# Patient Record
Sex: Female | Born: 1965
Health system: Southern US, Community
[De-identification: ages and names within clinical notes are randomized; demographics above are authoritative.]

## PROBLEM LIST (undated history)

## (undated) DIAGNOSIS — I10 Essential (primary) hypertension: Secondary | ICD-10-CM

## (undated) DIAGNOSIS — N289 Disorder of kidney and ureter, unspecified: Secondary | ICD-10-CM

## (undated) DIAGNOSIS — N184 Chronic kidney disease, stage 4 (severe): Secondary | ICD-10-CM

## (undated) HISTORY — PX: BREAST REDUCTION SURGERY: SHX8

## (undated) HISTORY — PX: ABDOMINAL HYSTERECTOMY: SHX81

## (undated) HISTORY — PX: CHOLECYSTECTOMY: SHX55

---

## 2003-08-11 ENCOUNTER — Emergency Department (HOSPITAL_COMMUNITY): Admission: EM | Admit: 2003-08-11 | Discharge: 2003-08-11 | Payer: Self-pay | Admitting: Emergency Medicine

## 2010-07-15 ENCOUNTER — Emergency Department (HOSPITAL_BASED_OUTPATIENT_CLINIC_OR_DEPARTMENT_OTHER)
Admission: EM | Admit: 2010-07-15 | Discharge: 2010-07-15 | Disposition: A | Discharge status: Home / Self Care | Payer: No Typology Code available for payment source | Attending: Emergency Medicine | Admitting: Emergency Medicine

## 2010-07-15 DIAGNOSIS — IMO0002 Reserved for concepts with insufficient information to code with codable children: Secondary | ICD-10-CM | POA: Insufficient documentation

## 2010-07-15 DIAGNOSIS — I10 Essential (primary) hypertension: Secondary | ICD-10-CM | POA: Insufficient documentation

## 2011-09-07 ENCOUNTER — Encounter (HOSPITAL_BASED_OUTPATIENT_CLINIC_OR_DEPARTMENT_OTHER): Payer: Self-pay | Admitting: Emergency Medicine

## 2011-09-07 ENCOUNTER — Emergency Department (HOSPITAL_BASED_OUTPATIENT_CLINIC_OR_DEPARTMENT_OTHER)
Admission: EM | Admit: 2011-09-07 | Discharge: 2011-09-07 | Disposition: A | Discharge status: Home / Self Care | Payer: No Typology Code available for payment source | Attending: Emergency Medicine | Admitting: Emergency Medicine

## 2011-09-07 DIAGNOSIS — J111 Influenza due to unidentified influenza virus with other respiratory manifestations: Secondary | ICD-10-CM | POA: Insufficient documentation

## 2011-09-07 DIAGNOSIS — J329 Chronic sinusitis, unspecified: Secondary | ICD-10-CM

## 2011-09-07 DIAGNOSIS — I10 Essential (primary) hypertension: Secondary | ICD-10-CM | POA: Insufficient documentation

## 2011-09-07 DIAGNOSIS — J45909 Unspecified asthma, uncomplicated: Secondary | ICD-10-CM | POA: Insufficient documentation

## 2011-09-07 HISTORY — DX: Essential (primary) hypertension: I10

## 2011-09-07 MED ORDER — AMOXICILLIN 500 MG PO CAPS: 500.0000 mg | ORAL_CAPSULE | Freq: Three times a day (TID) | ORAL | Status: AC

## 2011-09-07 NOTE — ED Provider Notes (Signed)
History     CSN: [id number]  Arrival date & time 09/07/11  1818   First MD Initiated Contact with Patient 09/07/11 1929      Chief Complaint  Patient presents with  . Influenza    (Consider location/radiation/quality/duration/timing/severity/associated sxs/prior treatment) Patient is a 46 y.o. female presenting with cough. The history is provided by the patient. No language interpreter was used.  Cough This is a new problem. The current episode started 2 days ago. The problem occurs constantly. The problem has been gradually worsening. The cough is productive of sputum. The maximum temperature recorded prior to her arrival was 100 to 100.9 F. The fever has been present for 1 to 2 days. Pertinent negatives include no shortness of breath. She has tried nothing for the symptoms. The treatment provided no relief. She is not a smoker. Her past medical history does not include pneumonia.   Pt complains of a soret hroat and a cough.  Pt reports sinus congestion and aching Past Medical History  Diagnosis Date  . Asthma   . Hypertension     Past Surgical History  Procedure Date  . Cholecystectomy   . Abdominal hysterectomy     No family history on file.  History  Substance Use Topics  . Smoking status: Never Smoker   . Smokeless tobacco: Not on file  . Alcohol Use: No    OB History    Grav Para Term Preterm Abortions TAB SAB Ect Mult Living                  Review of Systems  Respiratory: Positive for cough. Negative for shortness of breath.   All other systems reviewed and are negative.    Allergies  Review of patient's allergies indicates no known allergies.  Home Medications   Current Outpatient Rx  Name Route Sig Dispense Refill  . ALBUTEROL SULFATE HFA 108 (90 BASE) MCG/ACT IN AERS Inhalation Inhale 2 puffs into the lungs every 6 (six) hours as needed. Patient uses this medication for shortness of breath and prn.    . AMLODIPINE-OLMESARTAN 10-40 MG PO TABS  Oral Take 1 tablet by mouth daily.    . ASPIRIN-ACETAMINOPHEN-CAFFEINE 250-250-65 MG PO TABS Oral Take 2 tablets by mouth every 6 (six) hours as needed. Patient used this medication for her headache.    Marland Kitchen ALKA-SELTZER PLUS COLD PO Oral Take 2 tablets by mouth daily as needed. Patient used this medication for her cold.    Marland Kitchen BIOFREEZE EX Apply externally Apply 1 application topically daily as needed. Patient used this medication for her shoulder.      BP 165/119  Pulse 122  Temp(Src) 99.7 F (37.6 C) (Oral)  Resp 16  Ht 5\' 4"  (1.626 m)  Wt 250 lb (113.399 kg)  BMI 42.91 kg/m2  SpO2 100%  Physical Exam  Nursing note and vitals reviewed. Constitutional: She is oriented to person, place, and time. She appears well-developed and well-nourished.  HENT:  Head: Normocephalic and atraumatic.  Right Ear: External ear normal.  Left Ear: External ear normal.  Nose: Nose normal.  Mouth/Throat: Oropharynx is clear and moist.  Eyes: Conjunctivae and EOM are normal. Pupils are equal, round, and reactive to light.  Neck: Normal range of motion. Neck supple.  Cardiovascular: Normal rate.   Pulmonary/Chest: Effort normal.  Abdominal: Soft.  Musculoskeletal: Normal range of motion.  Neurological: She is alert and oriented to person, place, and time. She has normal reflexes.  Skin: Skin is warm.  Psychiatric:  She has a normal mood and affect.    ED Course  Procedures (including critical care time)  Labs Reviewed - No data to display No results found.   No diagnosis found.    MDM  Pt given rx for amoxicillin.    Pt advised to use her inhaler.         [place], [place] 09/07/11 2009

## 2011-09-07 NOTE — Discharge Instructions (Signed)

## 2011-09-07 NOTE — ED Notes (Signed)
Patient states around yesterday at 8am she started having a sore throat and started having aches "everywhere."  C/o body aches, headache, chills, and unknown if fever.  Denies n/v/d

## 2011-09-08 NOTE — ED Provider Notes (Signed)
Medical screening examination/treatment/procedure(s) were performed by non-physician practitioner and as supervising physician I was immediately available for consultation/collaboration.   Mylinda Latina III, MD 09/08/11 [phone]

## 2011-09-15 ENCOUNTER — Emergency Department (HOSPITAL_BASED_OUTPATIENT_CLINIC_OR_DEPARTMENT_OTHER)
Admission: EM | Admit: 2011-09-15 | Discharge: 2011-09-15 | Disposition: A | Discharge status: Home / Self Care | Payer: No Typology Code available for payment source | Attending: Emergency Medicine | Admitting: Emergency Medicine

## 2011-09-15 ENCOUNTER — Encounter (HOSPITAL_BASED_OUTPATIENT_CLINIC_OR_DEPARTMENT_OTHER): Payer: Self-pay | Admitting: *Deleted

## 2011-09-15 DIAGNOSIS — I10 Essential (primary) hypertension: Secondary | ICD-10-CM | POA: Insufficient documentation

## 2011-09-15 DIAGNOSIS — R21 Rash and other nonspecific skin eruption: Secondary | ICD-10-CM | POA: Insufficient documentation

## 2011-09-15 MED ORDER — LORATADINE 10 MG PO TABS
10.0000 mg | ORAL_TABLET | Freq: Once | ORAL | Status: AC
  Administered 2011-09-15: 10 mg via ORAL
  Filled 2011-09-15: qty 1

## 2011-09-15 MED ORDER — FAMOTIDINE 20 MG PO TABS: 20.0000 mg | ORAL_TABLET | Freq: Two times a day (BID) | ORAL | Status: DC

## 2011-09-15 MED ORDER — LORATADINE 10 MG PO TABS: 10.0000 mg | ORAL_TABLET | Freq: Every day | ORAL | Status: DC

## 2011-09-15 MED ORDER — FAMOTIDINE 20 MG PO TABS
20.0000 mg | ORAL_TABLET | Freq: Once | ORAL | Status: AC
  Administered 2011-09-15: 20 mg via ORAL
  Filled 2011-09-15: qty 1

## 2011-09-15 NOTE — Discharge Instructions (Signed)
Drug Rash Skin reactions can be caused by several different drugs. Allergy to the medicine can cause itching, hives, and other rashes. Sun exposure causes a red rash with some medicines. Mononucleosis virus can cause a similar red rash when you are taking antibiotics. Sometimes, the rash may be accompanied by pain. The drug rash may happen with new drugs or with medicines that you have been taking for a while. The rash cannot be spread from person to person. In most cases, the symptoms of a drug rash are gone within a few days of stopping the medicine. Your rash, including hives (urticaria), is most likely from the following medicines:  Antibiotics or antimicrobials.   Anticonvulsants or seizure medicines.   Antihypertensives or blood pressure medicines.   Antimalarials.   Antidepressants or depression medicines.   Antianxiety drugs.   Diuretics or water pills.   Nonsteroidal anti-inflammatory drugs.   Simvastatin.   Lithium.   Omeprazole.   Allopurinol.   Pseudoephedrine.   Amiodarone.   Packed red blood cells, when you get a blood transfusion.   Contrast media, such as when getting an imaging test (CT or CAT scan).  This drug list is not all inclusive, but drug rashes have been reported with all the medicines listed above.Your caregiver will tell you which medicines to avoid. If you react to a medicine, a similar or worse reaction can occur the next time you take it. If you need to stop taking an antibiotic because of a drug rash, an alternative antibiotic may be needed to get rid of your infection. Antihistamine or cortisone drugs may be prescribed to help relieve your symptoms. Stay out of the sun until the rash is completely gone.  Be sure to let your caregiver know about your drug reaction. Do not take this medicine in the future. Call your caregiver if your drug rash does not improve within 3 to 4 days. SEEK IMMEDIATE MEDICAL CARE IF:   You develop breathing problems,  swelling in the throat, or wheezing.   You have weakness, fainting, fever, and muscle or joint pains.   You develop blisters or peeling of skin, especially around the mouth.  Document Released: 06/09/2004 Document Revised: 04/21/2011 Document Reviewed: 03/20/2008 [facility] Patient Information 2012 [place].

## 2011-09-15 NOTE — ED Provider Notes (Signed)
History     CSN: [id number]  Arrival date & time 09/15/11  [id number]   First MD Initiated Contact with Patient 09/15/11 [phone]      Chief Complaint  Patient presents with  . Rash    HPI The patient presents with concerns of a rash.  Notably, the patient was evaluated here one week ago for generalized fatigue, myalgias, sinus congestion and diagnosed with sinus infection.  She is discharged with amoxicillin, which she notes that she began to take several days ago.  Over the past 3 days she has gradually developed a diffuse, slightly raised, pruritic rash.  She notes that even prior to her initiation of the antibiotics, her other complaints all subsided she now denies any myalgia, pharyngitis, dyspnea, cough, headache, congestion.  Since onset the rash has been persistent, irritating, nonpainful.  No attempts at relief with any medication.  No clear exacerbating factor.     Past Medical History  Diagnosis Date  . Asthma   . Hypertension     Past Surgical History  Procedure Date  . Cholecystectomy   . Abdominal hysterectomy     History reviewed. No pertinent family history.  History  Substance Use Topics  . Smoking status: Never Smoker   . Smokeless tobacco: Not on file  . Alcohol Use: No    OB History    Grav Para Term Preterm Abortions TAB SAB Ect Mult Living                  Review of Systems  Constitutional:       HPI  HENT:       HPI otherwise negative  Eyes: Negative.   Respiratory:       HPI, otherwise negative  Cardiovascular:       HPI, otherwise nmegative  Gastrointestinal: Negative for vomiting.  Genitourinary:       HPI, otherwise negative  Musculoskeletal:       HPI, otherwise negative  Skin: Negative.   Neurological: Negative for syncope.    Allergies  Review of patient's allergies indicates no known allergies.  Home Medications   Current Outpatient Rx  Name Route Sig Dispense Refill  . ALBUTEROL SULFATE HFA 108 (90 BASE) MCG/ACT IN AERS  Inhalation Inhale 2 puffs into the lungs every 6 (six) hours as needed. Patient uses this medication for shortness of breath and prn.    . AMLODIPINE-OLMESARTAN 10-40 MG PO TABS Oral Take 1 tablet by mouth daily.    . AMOXICILLIN 500 MG PO CAPS Oral Take 1 capsule (500 mg total) by mouth 3 (three) times daily. 30 capsule 0  . ASPIRIN-ACETAMINOPHEN-CAFFEINE 250-250-65 MG PO TABS Oral Take 2 tablets by mouth every 6 (six) hours as needed. Patient used this medication for her headache.    Marland Kitchen ALKA-SELTZER PLUS COLD PO Oral Take 2 tablets by mouth daily as needed. Patient used this medication for her cold.    Marland Kitchen FAMOTIDINE 20 MG PO TABS Oral Take 1 tablet (20 mg total) by mouth 2 (two) times daily. 10 tablet 0  . LORATADINE 10 MG PO TABS Oral Take 1 tablet (10 mg total) by mouth daily. 5 tablet 0  . BIOFREEZE EX Apply externally Apply 1 application topically daily as needed. Patient used this medication for her shoulder.      BP 155/116  Pulse 93  Temp(Src) 98.7 F (37.1 C) (Oral)  Ht 5\' 4"  (1.626 m)  Wt 213 lb (96.616 kg)  BMI 36.56 kg/m2  SpO2 99%  Physical Exam  Nursing note and vitals reviewed. Constitutional: She is oriented to person, place, and time. She appears well-developed and well-nourished. No distress.  HENT:  Head: Normocephalic and atraumatic. Head is without right periorbital erythema and without left periorbital erythema. No trismus in the jaw.  Mouth/Throat: Uvula is midline, oropharynx is clear and moist and mucous membranes are normal. No oropharyngeal exudate, posterior oropharyngeal edema, posterior oropharyngeal erythema or tonsillar abscesses.  Eyes: Conjunctivae and EOM are normal.  Cardiovascular: Normal rate and regular rhythm.   Pulmonary/Chest: Effort normal and breath sounds normal. No stridor. No respiratory distress.  Abdominal: She exhibits no distension.  Musculoskeletal: She exhibits no edema.  Neurological: She is alert and oriented to person, place, and  time. No cranial nerve deficit.  Skin: Skin is warm and dry.       There is a diffuse, mildly raised, non-erythematous rash throughout the patient's habitus.  There are no oral or glabrous skin lesions.  Psychiatric: She has a normal mood and affect.    ED Course  Procedures (including critical care time)  Labs Reviewed - No data to display No results found.   1. Rash       MDM  This generally well female now presents with concerns of rash that began following initiation of antibiotic therapy for a presumed sinus infection.  In general, the patient's denial of any other notable complaints his reassuring.  The patient's rash may be explained to her amoxicillin use, the absence of urticarial aspects is suggestive of this.  The patient's denial of any dyspnea, and the absence of any oropharyngeal edema is reassuring, the patient was discharged with instructions on antihistamine therapy for several days, and cessation of antibiotics.  She'll follow up with her primary care physician as needed.     Carmin Muskrat, MD 09/15/11 [phone]

## 2011-09-15 NOTE — ED Notes (Signed)
Reports she was seen last week diagnosed with sinus infection given prescription for amoxicillin didn't start taking until Friday yesterday she noticed a rash and had been itching

## 2012-04-20 ENCOUNTER — Encounter (HOSPITAL_BASED_OUTPATIENT_CLINIC_OR_DEPARTMENT_OTHER): Payer: Self-pay | Admitting: *Deleted

## 2012-04-20 ENCOUNTER — Emergency Department (HOSPITAL_BASED_OUTPATIENT_CLINIC_OR_DEPARTMENT_OTHER)
Admission: EM | Admit: 2012-04-20 | Discharge: 2012-04-20 | Disposition: A | Discharge status: Home / Self Care | Payer: No Typology Code available for payment source | Attending: Emergency Medicine | Admitting: Emergency Medicine

## 2012-04-20 DIAGNOSIS — Z7982 Long term (current) use of aspirin: Secondary | ICD-10-CM | POA: Insufficient documentation

## 2012-04-20 DIAGNOSIS — I10 Essential (primary) hypertension: Secondary | ICD-10-CM | POA: Insufficient documentation

## 2012-04-20 DIAGNOSIS — Z79899 Other long term (current) drug therapy: Secondary | ICD-10-CM | POA: Insufficient documentation

## 2012-04-20 DIAGNOSIS — J45909 Unspecified asthma, uncomplicated: Secondary | ICD-10-CM | POA: Insufficient documentation

## 2012-04-20 DIAGNOSIS — M719 Bursopathy, unspecified: Secondary | ICD-10-CM | POA: Insufficient documentation

## 2012-04-20 DIAGNOSIS — M67919 Unspecified disorder of synovium and tendon, unspecified shoulder: Secondary | ICD-10-CM | POA: Insufficient documentation

## 2012-04-20 MED ORDER — HYDROCODONE-ACETAMINOPHEN 5-325 MG PO TABS: 2.0000 | ORAL_TABLET | Freq: Four times a day (QID) | ORAL | Status: DC | PRN

## 2012-04-20 MED ORDER — OXYCODONE-ACETAMINOPHEN 5-325 MG PO TABS: 2.0000 | ORAL_TABLET | Freq: Four times a day (QID) | ORAL | Status: DC | PRN

## 2012-04-20 NOTE — ED Provider Notes (Signed)
History     CSN: [id number]  Arrival date & time 04/20/12  0609   First MD Initiated Contact with Patient 04/20/12 [phone]      Chief Complaint  Patient presents with  . Shoulder Pain    (Consider location/radiation/quality/duration/timing/severity/associated sxs/prior treatment) HPI This 46 year old female has a few days of gradual onset constant severe left shoulder pain worse with any position change especially left shoulder flexion and trying to move her arm above her head which she is unable to do due to the pain, she has no distal weakness or numbness to her left hand, she is no neck pain or back pain or trauma, she is no chest pain shortness breath abdominal pain or back pain, she is no swelling or rash, she is no treatment prior to arrival, her pain is severe, her pain is without radiation. Her pain is nonexertional. Past Medical History  Diagnosis Date  . Asthma   . Hypertension     Past Surgical History  Procedure Date  . Cholecystectomy   . Abdominal hysterectomy     No family history on file.  History  Substance Use Topics  . Smoking status: Never Smoker   . Smokeless tobacco: Not on file  . Alcohol Use: No    OB History    Grav Para Term Preterm Abortions TAB SAB Ect Mult Living                  Review of Systems 10 Systems reviewed and are negative for acute change except as noted in the HPI. Allergies  Amoxicillin and Vicodin  Home Medications   Current Outpatient Rx  Name  Route  Sig  Dispense  Refill  . ALBUTEROL SULFATE HFA 108 (90 BASE) MCG/ACT IN AERS   Inhalation   Inhale 2 puffs into the lungs every 6 (six) hours as needed. Patient uses this medication for shortness of breath and prn.         . AMLODIPINE-OLMESARTAN 10-40 MG PO TABS   Oral   Take 1 tablet by mouth daily.         . ASPIRIN-ACETAMINOPHEN-CAFFEINE 250-250-65 MG PO TABS   Oral   Take 2 tablets by mouth every 6 (six) hours as needed. Patient used this medication for her  headache.         Marland Kitchen ALKA-SELTZER PLUS COLD PO   Oral   Take 2 tablets by mouth daily as needed. Patient used this medication for her cold.         Marland Kitchen FAMOTIDINE 20 MG PO TABS   Oral   Take 1 tablet (20 mg total) by mouth 2 (two) times daily.   10 tablet   0   . LORATADINE 10 MG PO TABS   Oral   Take 1 tablet (10 mg total) by mouth daily.   5 tablet   0   . BIOFREEZE EX   Apply externally   Apply 1 application topically daily as needed. Patient used this medication for her shoulder.         . OXYCODONE-ACETAMINOPHEN 5-325 MG PO TABS   Oral   Take 2 tablets by mouth every 6 (six) hours as needed for pain.   20 tablet   0     BP 198/112  Pulse 80  Temp 98.4 F (36.9 C) (Oral)  Resp 18  Wt 230 lb (104.327 kg)  SpO2 100%  Physical Exam  Nursing note and vitals reviewed. Constitutional:  Awake, alert, nontoxic appearance.  HENT:  Head: Atraumatic.  Eyes: Right eye exhibits no discharge. Left eye exhibits no discharge.  Neck: Neck supple.  Cardiovascular: Normal rate and regular rhythm.   No murmur heard. Pulmonary/Chest: Effort normal and breath sounds normal. No respiratory distress. She has no wheezes. She has no rales. She exhibits no tenderness.  Abdominal: Soft. There is no tenderness. There is no rebound.  Musculoskeletal: She exhibits tenderness. She exhibits no edema.       Baseline ROM, no obvious new focal weakness. Cervical spine nontender, back nontender, right arm and both legs nontender, left arm is isolated tenderness to the left anterior shoulder only with decreased range of motion to the left shoulder due to the pain, she is unable to move her left arm above her head due to the pain, she does have the capability of the ducting her left shoulder to 90 without much difficulty and she has a negative shoulder drop test, she has limited left shoulder flexion due to the pain and tenderness to the anterior shoulder, she is no tenderness to the biceps  or triceps elbow forearm wrist or hand, she has capillary refill less than 2 seconds with normal light touch to the left hand, she is intact strength and sensation in the distributions of the median, radial, and ulnar nerve function to the left hand, she has normal light touch over her deltoid region.  Neurological:       Mental status and motor strength appears baseline for patient and situation.  Skin: No rash noted.  Psychiatric: She has a normal mood and affect.    ED Course  Procedures (including critical care time)  Labs Reviewed - No data to display No results found.   1. Shoulder tendinitis       MDM  Patient informed of clinical course, understand medical decision-making process, and agree with plan.  I doubt any other EMC precluding discharge at this time including, but not necessarily limited to the following:ACS, radiculopathy, CVA, DVT.        Babette Relic, MD 04/20/12 [phone]

## 2012-04-20 NOTE — ED Notes (Signed)
Pt c/o left shoulder pain x3 days. Pt sts pain worse with movement. Pt denies injury but sts she does heavy lifting at work.

## 2012-04-20 NOTE — ED Notes (Signed)
MD at bedside. 

## 2012-08-16 DIAGNOSIS — J45909 Unspecified asthma, uncomplicated: Secondary | ICD-10-CM | POA: Insufficient documentation

## 2012-10-20 DIAGNOSIS — J309 Allergic rhinitis, unspecified: Secondary | ICD-10-CM | POA: Insufficient documentation

## 2012-10-20 DIAGNOSIS — I10 Essential (primary) hypertension: Secondary | ICD-10-CM | POA: Insufficient documentation

## 2012-10-20 DIAGNOSIS — E785 Hyperlipidemia, unspecified: Secondary | ICD-10-CM | POA: Insufficient documentation

## 2012-10-25 ENCOUNTER — Emergency Department (HOSPITAL_BASED_OUTPATIENT_CLINIC_OR_DEPARTMENT_OTHER)
Admission: EM | Admit: 2012-10-25 | Discharge: 2012-10-25 | Disposition: A | Discharge status: Home / Self Care | Payer: No Typology Code available for payment source | Attending: Emergency Medicine | Admitting: Emergency Medicine

## 2012-10-25 ENCOUNTER — Emergency Department (HOSPITAL_BASED_OUTPATIENT_CLINIC_OR_DEPARTMENT_OTHER): Payer: No Typology Code available for payment source

## 2012-10-25 ENCOUNTER — Encounter (HOSPITAL_BASED_OUTPATIENT_CLINIC_OR_DEPARTMENT_OTHER): Payer: Self-pay | Admitting: *Deleted

## 2012-10-25 DIAGNOSIS — Z9089 Acquired absence of other organs: Secondary | ICD-10-CM | POA: Insufficient documentation

## 2012-10-25 DIAGNOSIS — R109 Unspecified abdominal pain: Secondary | ICD-10-CM | POA: Insufficient documentation

## 2012-10-25 DIAGNOSIS — Z79899 Other long term (current) drug therapy: Secondary | ICD-10-CM | POA: Insufficient documentation

## 2012-10-25 DIAGNOSIS — Z90710 Acquired absence of both cervix and uterus: Secondary | ICD-10-CM | POA: Insufficient documentation

## 2012-10-25 DIAGNOSIS — J45909 Unspecified asthma, uncomplicated: Secondary | ICD-10-CM | POA: Insufficient documentation

## 2012-10-25 DIAGNOSIS — IMO0002 Reserved for concepts with insufficient information to code with codable children: Secondary | ICD-10-CM

## 2012-10-25 DIAGNOSIS — M51379 Other intervertebral disc degeneration, lumbosacral region without mention of lumbar back pain or lower extremity pain: Secondary | ICD-10-CM | POA: Insufficient documentation

## 2012-10-25 DIAGNOSIS — M5432 Sciatica, left side: Secondary | ICD-10-CM

## 2012-10-25 DIAGNOSIS — I10 Essential (primary) hypertension: Secondary | ICD-10-CM | POA: Insufficient documentation

## 2012-10-25 DIAGNOSIS — Z880 Allergy status to penicillin: Secondary | ICD-10-CM | POA: Insufficient documentation

## 2012-10-25 DIAGNOSIS — M5430 Sciatica, unspecified side: Secondary | ICD-10-CM | POA: Insufficient documentation

## 2012-10-25 DIAGNOSIS — M5137 Other intervertebral disc degeneration, lumbosacral region: Secondary | ICD-10-CM | POA: Insufficient documentation

## 2012-10-25 LAB — URINE MICROSCOPIC-ADD ON

## 2012-10-25 LAB — URINALYSIS, ROUTINE W REFLEX MICROSCOPIC
Leukocytes, UA: NEGATIVE
Nitrite: NEGATIVE
Specific Gravity, Urine: 1.014 (ref 1.005–1.030)
pH: 5.5 (ref 5.0–8.0)

## 2012-10-25 MED ORDER — OXYCODONE-ACETAMINOPHEN 5-325 MG PO TABS: 1.0000 | ORAL_TABLET | Freq: Four times a day (QID) | ORAL | Status: DC | PRN

## 2012-10-25 NOTE — ED Notes (Signed)
MD at bedside. 

## 2012-10-25 NOTE — ED Provider Notes (Signed)
History     CSN: [id number]  Arrival date & time 10/25/12  [id number]   First MD Initiated Contact with Patient 10/25/12 0403      Chief Complaint  Patient presents with  . Back Pain    (Consider location/radiation/quality/duration/timing/severity/associated sxs/prior treatment) HPI This 47 year old female with a three-week history of left lower back pain. The pain is sharp and crampy and radiates down the back of her left leg and buttock as well as around to her groin. She has also had left flank pain. It has worsened over the past few weeks and now there is an element of pain on the right side as well. It is worse with movement of her lower back and with certain positions. She is having difficulty sleeping on her left side. The pain is moderate to severe. She has been taking ibuprofen without relief. She denies numbness, weakness, bowel or bladder changes. She denies injury.  Past Medical History  Diagnosis Date  . Asthma   . Hypertension     Past Surgical History  Procedure Laterality Date  . Cholecystectomy    . Abdominal hysterectomy      No family history on file.  History  Substance Use Topics  . Smoking status: Never Smoker   . Smokeless tobacco: Not on file  . Alcohol Use: No    OB History   Grav Para Term Preterm Abortions TAB SAB Ect Mult Living                  Review of Systems  All other systems reviewed and are negative.    Allergies  Amoxicillin and Vicodin  Home Medications   Current Outpatient Rx  Name  Route  Sig  Dispense  Refill  . albuterol (PROVENTIL HFA;VENTOLIN HFA) 108 (90 BASE) MCG/ACT inhaler   Inhalation   Inhale 2 puffs into the lungs every 6 (six) hours as needed. Patient uses this medication for shortness of breath and prn.         Marland Kitchen amLODipine-olmesartan (AZOR) 10-40 MG per tablet   Oral   Take 1 tablet by mouth daily.         Marland Kitchen aspirin-acetaminophen-caffeine (EXCEDRIN MIGRAINE) 250-250-65 MG per tablet   Oral   Take 2  tablets by mouth every 6 (six) hours as needed. Patient used this medication for her headache.         . Chlorphen-Phenyleph-ASA (ALKA-SELTZER PLUS COLD PO)   Oral   Take 2 tablets by mouth daily as needed. Patient used this medication for her cold.         Marland Kitchen EXPIRED: famotidine (PEPCID) 20 MG tablet   Oral   Take 1 tablet (20 mg total) by mouth 2 (two) times daily.   10 tablet   0   . EXPIRED: loratadine (CLARITIN) 10 MG tablet   Oral   Take 1 tablet (10 mg total) by mouth daily.   5 tablet   0   . Menthol, Topical Analgesic, (BIOFREEZE EX)   Apply externally   Apply 1 application topically daily as needed. Patient used this medication for her shoulder.         Marland Kitchen oxyCODONE-acetaminophen (PERCOCET) 5-325 MG per tablet   Oral   Take 2 tablets by mouth every 6 (six) hours as needed for pain.   20 tablet   0     BP 180/135  Pulse 84  Temp(Src) 98.2 F (36.8 C) (Oral)  Resp 18  Ht 5\' 4"  (1.626 m)  Wt 220 lb (99.791 kg)  BMI 37.74 kg/m2  SpO2 98%  Physical Exam General: Well-developed, well-nourished female in no acute distress; appearance consistent with age of record HENT: normocephalic, atraumatic Eyes: pupils equal round and reactive to light; extraocular muscles intact Neck: supple Heart: regular rate and rhythm Lungs: clear to auscultation bilaterally Abdomen: soft; nondistended; nontender Back: Left paraspinal lumbar tenderness; pain on movement of lower back Extremities: No deformity; full range of motion; pulses normal Neurologic: Awake, alert and oriented; motor function intact in all extremities and symmetric; no facial droop Skin: Warm and dry Psychiatric: Flat affect    ED Course  Procedures (including critical care time)     MDM   Nursing notes and vitals signs, including pulse oximetry, reviewed.  Summary of this visit's results, reviewed by myself:  Labs:  Results for orders placed during the hospital encounter of 10/25/12 (from  the past 24 hour(s))  URINALYSIS, ROUTINE W REFLEX MICROSCOPIC     Status: Abnormal   Collection Time    10/25/12  4:02 AM      Result Value Range   Color, Urine YELLOW  YELLOW   APPearance CLEAR  CLEAR   Specific Gravity, Urine 1.014  1.005 - 1.030   pH 5.5  5.0 - 8.0   Glucose, UA NEGATIVE  NEGATIVE mg/dL   Hgb urine dipstick MODERATE (*) NEGATIVE   Bilirubin Urine NEGATIVE  NEGATIVE   Ketones, ur NEGATIVE  NEGATIVE mg/dL   Protein, ur 100 (*) NEGATIVE mg/dL   Urobilinogen, UA 0.2  0.0 - 1.0 mg/dL   Nitrite NEGATIVE  NEGATIVE   Leukocytes, UA NEGATIVE  NEGATIVE  URINE MICROSCOPIC-ADD ON     Status: Abnormal   Collection Time    10/25/12  4:02 AM      Result Value Range   Squamous Epithelial / LPF FEW (*) RARE   WBC, UA 0-2  <3 WBC/hpf   RBC / HPF 7-10  <3 RBC/hpf   Bacteria, UA RARE  RARE   Casts HYALINE CASTS (*) NEGATIVE   Ct Abdomen Pelvis Wo Contrast  10/25/2012   *RADIOLOGY REPORT*  Clinical Data: Left flank pain and back pain.  CT ABDOMEN AND PELVIS WITHOUT CONTRAST  Technique:  Multidetector CT imaging of the abdomen and pelvis was performed following the standard protocol without intravenous contrast.  Comparison: None.  Findings: Focal atelectasis in the right lung base.  The kidneys appear symmetrical in size and shape.  No pyelocaliectasis or ureterectasis.  No renal, ureteral, or bladder stones are visualized.  The bladder is decompressed and cannot be evaluated for wall thickness.  Surgical absence of the gallbladder.  Small accessory spleen.  The unenhanced appearance of the liver, spleen, pancreas, adrenal glands, abdominal aorta, and retroperitoneal lymph nodes is unremarkable.  The stomach and small bowel are decompressed.  Stool filled colon without distension.  No free air or free fluid in the abdomen.  Small midline ventral abdominal hernia containing fat.  Pelvis:  Uterus appears surgically absent.  No abnormal adnexal masses.  Diverticula in the sigmoid colon  without evidence of diverticulitis.  The appendix is normal.  Degenerative changes in the lumbar spine.  Suggestion of disc protrusion or bulging annulus at L4-5.  IMPRESSION: No renal or ureteral stone or obstruction demonstrated.   Original Report Authenticated By: Lucienne Capers, M.D.          Wynetta Fines, MD 10/25/12 [phone]

## 2012-10-25 NOTE — ED Notes (Signed)
Patient transported to CT 

## 2012-10-25 NOTE — ED Notes (Signed)
Pt returned from ct

## 2012-10-25 NOTE — ED Notes (Signed)
Pt c/o cramping type pain in her bilat lower back x3 weeks. Pt denies urinary difficulty or odor.

## 2012-11-02 ENCOUNTER — Ambulatory Visit (INDEPENDENT_AMBULATORY_CARE_PROVIDER_SITE_OTHER): Payer: No Typology Code available for payment source | Admitting: Family Medicine

## 2012-11-02 ENCOUNTER — Encounter: Payer: Self-pay | Admitting: Family Medicine

## 2012-11-02 VITALS — BP 191/143 | HR 87 | Ht 64.0 in | Wt 225.0 lb

## 2012-11-02 DIAGNOSIS — M545 Low back pain: Secondary | ICD-10-CM

## 2012-11-02 MED ORDER — CYCLOBENZAPRINE HCL 10 MG PO TABS: 10.0000 mg | ORAL_TABLET | Freq: Three times a day (TID) | ORAL | Status: AC | PRN

## 2012-11-02 NOTE — Patient Instructions (Addendum)
You have lumbar radiculopathy (a pinched nerve in your low back from a bulging disc). Take tylenol for baseline pain relief (1-2 extra strength tabs 3x/day) Ibuprofen 800mg  three times a day with food for pain and inflammation. Flexeril as needed for muscle spasms (no driving on this medicine if it makes you sleepy). Stay as active as possible. Physical therapy has been shown to be helpful as well - start this and do home exercises on days you don't go to therapy. Strengthening of low back muscles, abdominal musculature are key for long term pain relief. If not improving, will consider further imaging (MRI). Follow up with me in 6 weeks for reevaluation.

## 2012-11-05 ENCOUNTER — Encounter: Payer: Self-pay | Admitting: Family Medicine

## 2012-11-05 DIAGNOSIS — M545 Low back pain: Secondary | ICD-10-CM | POA: Insufficient documentation

## 2012-11-05 NOTE — Progress Notes (Signed)
Patient ID: Kathy Elliott, female   DOB: 1965-11-08, 47 y.o.   MRN: [id number]  PCP: No primary provider on file.  Subjective:   HPI: Patient is a 47 y.o. female here for low back pain.  Patient states she does a lot of lifting at work. About 4 weeks ago started to get low back pain with radiation into left leg. Treated with percocet as needed for pain, rest. States radiation into left leg has resolved and pain is better now. No numbness or tingling. No bowel/bladder dysfunction.  Past Medical History  Diagnosis Date  . Asthma   . Hypertension     Current Outpatient Prescriptions on File Prior to Visit  Medication Sig Dispense Refill  . amLODipine-olmesartan (AZOR) 10-40 MG per tablet Take 1 tablet by mouth daily.      Marland Kitchen albuterol (PROVENTIL HFA;VENTOLIN HFA) 108 (90 BASE) MCG/ACT inhaler Inhale 2 puffs into the lungs every 6 (six) hours as needed. Patient uses this medication for shortness of breath and prn.      . aspirin-acetaminophen-caffeine (EXCEDRIN MIGRAINE) 250-250-65 MG per tablet Take 2 tablets by mouth every 6 (six) hours as needed. Patient used this medication for her headache.      . Chlorphen-Phenyleph-ASA (ALKA-SELTZER PLUS COLD PO) Take 2 tablets by mouth daily as needed. Patient used this medication for her cold.      . famotidine (PEPCID) 20 MG tablet Take 1 tablet (20 mg total) by mouth 2 (two) times daily.  10 tablet  0  . loratadine (CLARITIN) 10 MG tablet Take 1 tablet (10 mg total) by mouth daily.  5 tablet  0  . Menthol, Topical Analgesic, (BIOFREEZE EX) Apply 1 application topically daily as needed. Patient used this medication for her shoulder.      Marland Kitchen oxyCODONE-acetaminophen (PERCOCET) 5-325 MG per tablet Take 1-2 tablets by mouth every 6 (six) hours as needed for pain.  20 tablet  0   No current facility-administered medications on file prior to visit.    Past Surgical History  Procedure Laterality Date  . Cholecystectomy    . Abdominal hysterectomy     . Breast reduction surgery      Allergies  Allergen Reactions  . Amoxicillin Hives  . Vicodin (Hydrocodone-Acetaminophen) Nausea And Vomiting    History   Social History  . Marital Status: Married    Spouse Name: N/A    Number of Children: N/A  . Years of Education: N/A   Occupational History  . Not on file.   Social History Main Topics  . Smoking status: Never Smoker   . Smokeless tobacco: Not on file  . Alcohol Use: No  . Drug Use: No  . Sexually Active: Not on file   Other Topics Concern  . Not on file   Social History Narrative  . No narrative on file    Family History  Problem Relation Age of Onset  . Hypertension Mother   . Diabetes Neg Hx   . Heart attack Neg Hx   . Hyperlipidemia Neg Hx   . Sudden death Neg Hx     BP 191/143  Pulse 87  Ht 5\' 4"  (1.626 m)  Wt 225 lb (102.059 kg)  BMI 38.6 kg/m2  Review of Systems: See HPI above.    Objective:  Physical Exam:  Gen: NAD  Back: No gross deformity, scoliosis. TTP minimal Left paraspinal lumbar region.  No midline or bony TTP. FROM with mild pain on flexion. Strength LEs 5/5 all muscle groups.  1+ MSRs in patellar and achilles tendons, equal bilaterally. Negative SLRs. Sensation intact to light touch bilaterally. Negative logroll bilateral hips Negative fabers and piriformis stretches.    Assessment & Plan:  1. Low back pain - description consistent with lumbar radiculopathy - has had improvement since ED visit.  Tylenol, ibuprofen, flexeril.  Start physical therapy and home exercise program.  F/u in 6 weeks.  Consider further imaging if not improving.

## 2012-11-05 NOTE — Assessment & Plan Note (Signed)
description consistent with lumbar radiculopathy - has had improvement since ED visit.  Tylenol, ibuprofen, flexeril.  Start physical therapy and home exercise program.  F/u in 6 weeks.  Consider further imaging if not improving.

## 2012-12-16 ENCOUNTER — Encounter (HOSPITAL_BASED_OUTPATIENT_CLINIC_OR_DEPARTMENT_OTHER): Payer: Self-pay | Admitting: *Deleted

## 2012-12-16 ENCOUNTER — Emergency Department (HOSPITAL_BASED_OUTPATIENT_CLINIC_OR_DEPARTMENT_OTHER)
Admission: EM | Admit: 2012-12-16 | Discharge: 2012-12-16 | Disposition: A | Discharge status: Home / Self Care | Payer: No Typology Code available for payment source | Attending: Emergency Medicine | Admitting: Emergency Medicine

## 2012-12-16 ENCOUNTER — Emergency Department (HOSPITAL_BASED_OUTPATIENT_CLINIC_OR_DEPARTMENT_OTHER): Payer: No Typology Code available for payment source

## 2012-12-16 DIAGNOSIS — I10 Essential (primary) hypertension: Secondary | ICD-10-CM | POA: Insufficient documentation

## 2012-12-16 DIAGNOSIS — J45909 Unspecified asthma, uncomplicated: Secondary | ICD-10-CM | POA: Insufficient documentation

## 2012-12-16 DIAGNOSIS — J40 Bronchitis, not specified as acute or chronic: Secondary | ICD-10-CM

## 2012-12-16 DIAGNOSIS — Z79899 Other long term (current) drug therapy: Secondary | ICD-10-CM | POA: Insufficient documentation

## 2012-12-16 MED ORDER — AZITHROMYCIN 250 MG PO TABS: ORAL_TABLET | ORAL | Status: DC

## 2012-12-16 MED ORDER — PREDNISONE 20 MG PO TABS: ORAL_TABLET | ORAL | Status: DC

## 2012-12-16 MED ORDER — ACETAMINOPHEN 500 MG PO TABS
1000.0000 mg | ORAL_TABLET | Freq: Four times a day (QID) | ORAL | Status: DC | PRN
  Administered 2012-12-16: 1000 mg via ORAL

## 2012-12-16 MED ORDER — ACETAMINOPHEN 500 MG PO TABS
ORAL_TABLET | ORAL | Status: AC
  Administered 2012-12-16: 1000 mg via ORAL
  Filled 2012-12-16: qty 2

## 2012-12-16 MED ORDER — ACETAMINOPHEN 325 MG PO TABS: 650.0000 mg | ORAL_TABLET | Freq: Four times a day (QID) | ORAL | Status: DC | PRN

## 2012-12-16 NOTE — ED Provider Notes (Signed)
CSN: [id number]     Arrival date & time 12/16/12  1759 History     First MD Initiated Contact with Patient 12/16/12 1815     Chief Complaint  Patient presents with  . Cough   (Consider location/radiation/quality/duration/timing/severity/associated sxs/prior Treatment) Patient is a 47 y.o. female presenting with cough. The history is provided by the patient. No language interpreter was used.  Cough Cough characteristics:  Productive Severity:  Moderate Onset quality:  Gradual Timing:  Constant Progression:  Worsening Chronicity:  New Associated symptoms: no fever and no rhinorrhea     Past Medical History  Diagnosis Date  . Asthma   . Hypertension    Past Surgical History  Procedure Laterality Date  . Cholecystectomy    . Abdominal hysterectomy    . Breast reduction surgery     Family History  Problem Relation Age of Onset  . Hypertension Mother   . Diabetes Neg Hx   . Heart attack Neg Hx   . Hyperlipidemia Neg Hx   . Sudden death Neg Hx    History  Substance Use Topics  . Smoking status: Never Smoker   . Smokeless tobacco: Not on file  . Alcohol Use: No   OB History   Grav Para Term Preterm Abortions TAB SAB Ect Mult Living                 Review of Systems  Constitutional: Negative for fever.  HENT: Negative for rhinorrhea.   Respiratory: Positive for cough.   All other systems reviewed and are negative.    Allergies  Amoxicillin and Vicodin  Home Medications   Current Outpatient Rx  Name  Route  Sig  Dispense  Refill  . albuterol (PROVENTIL HFA;VENTOLIN HFA) 108 (90 BASE) MCG/ACT inhaler   Inhalation   Inhale 2 puffs into the lungs every 6 (six) hours as needed. Patient uses this medication for shortness of breath and prn.         . aspirin-acetaminophen-caffeine (EXCEDRIN MIGRAINE) 250-250-65 MG per tablet   Oral   Take 2 tablets by mouth every 6 (six) hours as needed. Patient used this medication for her headache.         .  Chlorphen-Phenyleph-ASA (ALKA-SELTZER PLUS COLD PO)   Oral   Take 2 tablets by mouth daily as needed. Patient used this medication for her cold.         Marland Kitchen amLODipine-olmesartan (AZOR) 10-40 MG per tablet   Oral   Take 1 tablet by mouth daily.         . cyclobenzaprine (FLEXERIL) 10 MG tablet   Oral   Take 1 tablet (10 mg total) by mouth every 8 (eight) hours as needed for muscle spasms.   60 tablet   1   . EXPIRED: famotidine (PEPCID) 20 MG tablet   Oral   Take 1 tablet (20 mg total) by mouth 2 (two) times daily.   10 tablet   0   . EXPIRED: loratadine (CLARITIN) 10 MG tablet   Oral   Take 1 tablet (10 mg total) by mouth daily.   5 tablet   0   . Menthol, Topical Analgesic, (BIOFREEZE EX)   Apply externally   Apply 1 application topically daily as needed. Patient used this medication for her shoulder.         Marland Kitchen oxyCODONE-acetaminophen (PERCOCET) 5-325 MG per tablet   Oral   Take 1-2 tablets by mouth every 6 (six) hours as needed for pain.  20 tablet   0    BP 139/79  Pulse 113  Temp(Src) 100.1 F (37.8 C) (Oral)  Resp 18  Ht 5\' 4"  (1.626 m)  Wt 214 lb (97.07 kg)  BMI 36.72 kg/m2  SpO2 99% Physical Exam  Nursing note and vitals reviewed. Constitutional: She appears well-developed and well-nourished.  HENT:  Head: Normocephalic.  Right Ear: External ear normal.  Nose: Nose normal.  Mouth/Throat: Oropharynx is clear and moist.  Eyes: Conjunctivae and EOM are normal. Pupils are equal, round, and reactive to light.  Neck: Normal range of motion.  Cardiovascular: Normal rate and normal heart sounds.   Pulmonary/Chest: Effort normal.  Abdominal: Soft.  Musculoskeletal: Normal range of motion.  Neurological: She is alert.  Skin: Skin is warm.  Psychiatric: She has a normal mood and affect.   Results for orders placed during the hospital encounter of 10/25/12  URINALYSIS, ROUTINE W REFLEX MICROSCOPIC      Result Value Range   Color, Urine YELLOW   YELLOW   APPearance CLEAR  CLEAR   Specific Gravity, Urine 1.014  1.005 - 1.030   pH 5.5  5.0 - 8.0   Glucose, UA NEGATIVE  NEGATIVE mg/dL   Hgb urine dipstick MODERATE (*) NEGATIVE   Bilirubin Urine NEGATIVE  NEGATIVE   Ketones, ur NEGATIVE  NEGATIVE mg/dL   Protein, ur 100 (*) NEGATIVE mg/dL   Urobilinogen, UA 0.2  0.0 - 1.0 mg/dL   Nitrite NEGATIVE  NEGATIVE   Leukocytes, UA NEGATIVE  NEGATIVE  URINE MICROSCOPIC-ADD ON      Result Value Range   Squamous Epithelial / LPF FEW (*) RARE   WBC, UA 0-2  <3 WBC/hpf   RBC / HPF 7-10  <3 RBC/hpf   Bacteria, UA RARE  RARE   Casts HYALINE CASTS (*) NEGATIVE   Dg Chest 2 View  12/16/2012   *RADIOLOGY REPORT*  Clinical Data: Cough, congestion  CHEST - 2 VIEW  Comparison: None.  Findings: Lungs are clear. No pleural effusion or pneumothorax.  Cardiomediastinal silhouette is within normal limits.  Visualized osseous structures are within normal limits.  IMPRESSION: No evidence of acute cardiopulmonary disease.   Original Report Authenticated By: Julian Hy, M.D.    ED Course   Procedures (including critical care time)  Labs Reviewed - No data to display No results found. 1. Bronchitis     MDM  Pt has been using her inhaler frequently.   I am going to treat with prednisone and zithromax    Fransico Meadow, PA-C 12/16/12 1916

## 2012-12-16 NOTE — ED Notes (Signed)
C/o cough, congestion, fevers since Friday.

## 2012-12-16 NOTE — ED Notes (Signed)
BP results given to EDPA. Pt given tylenol prior to d/c and instructed to have b/p rechecked this week by her PCP. Also to return if headache does not resolve and/or for any new sx

## 2012-12-16 NOTE — ED Notes (Signed)
Rx x 2 given for  zithromax and prednisone.

## 2012-12-17 NOTE — ED Provider Notes (Signed)
Medical screening examination/treatment/procedure(s) were performed by non-physician practitioner and as supervising physician I was immediately available for consultation/collaboration.  Carmin Muskrat, MD 12/17/12 [phone]

## 2013-03-21 ENCOUNTER — Other Ambulatory Visit: Payer: Self-pay

## 2014-03-21 DIAGNOSIS — N189 Chronic kidney disease, unspecified: Secondary | ICD-10-CM | POA: Insufficient documentation

## 2014-03-25 DIAGNOSIS — R9089 Other abnormal findings on diagnostic imaging of central nervous system: Secondary | ICD-10-CM | POA: Insufficient documentation

## 2014-07-29 DIAGNOSIS — G4726 Circadian rhythm sleep disorder, shift work type: Secondary | ICD-10-CM | POA: Insufficient documentation

## 2014-08-13 IMAGING — CT CT ABD-PELV W/O CM
2 of 4 series · 17 of 46 positions shown, 19 images · non-contrast
Comparison: None.

CLINICAL DATA: Left flank pain and back pain.

CT ABDOMEN AND PELVIS WITHOUT CONTRAST
TECHNIQUE: Multidetector CT imaging of the abdomen and pelvis was
performed following the standard protocol without intravenous
contrast.

[Series 2: renal stone > 200 lbs 5.0 b31f · axial · 0.87mm/px · z∈[-492,-108]mm · 14 of 85 slices shown, 16 images]
[im 4/85  soft-tissue]
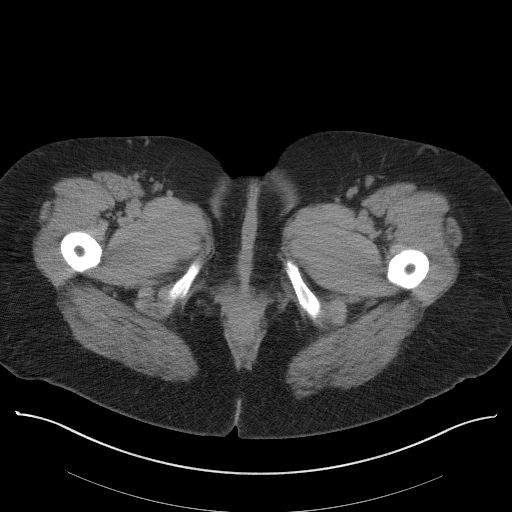
[im 4/85  bone]
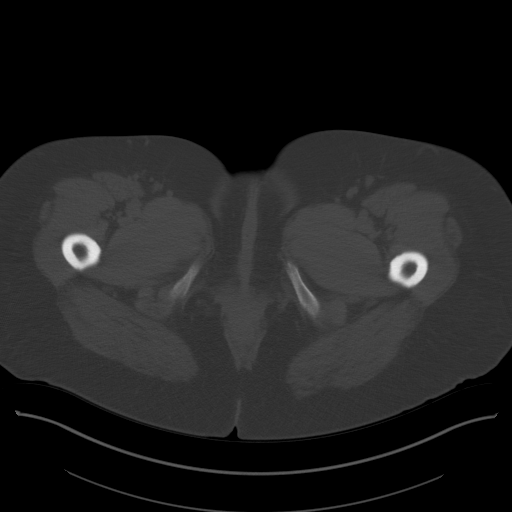
[im 11/85  soft-tissue]
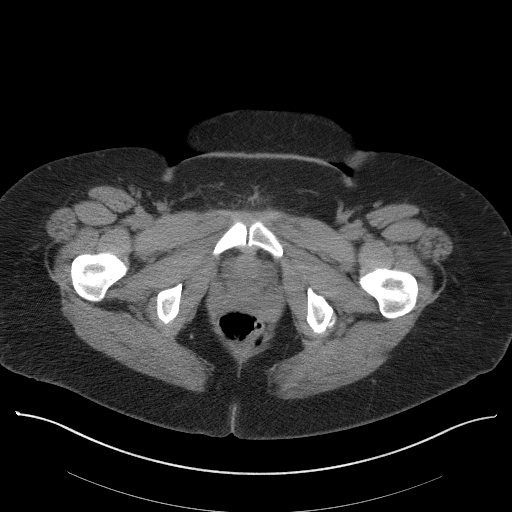
[im 15/85  soft-tissue]
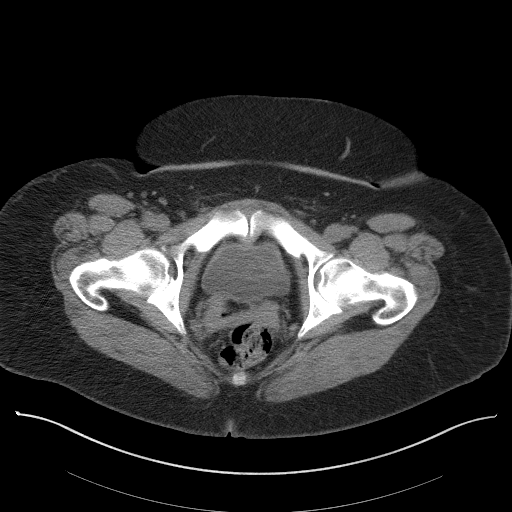
[im 22/85  soft-tissue]
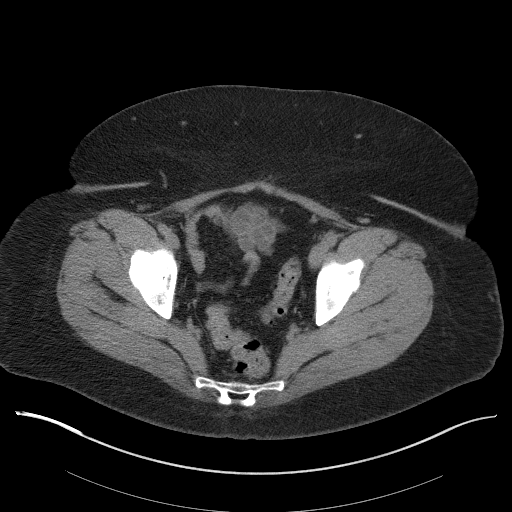
[im 30/85  soft-tissue]
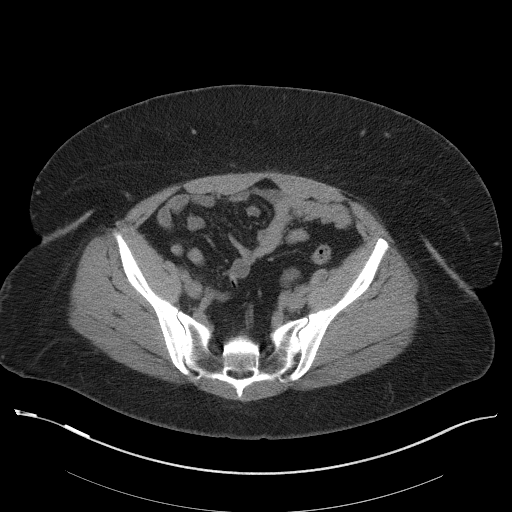
[im 33/85  soft-tissue]
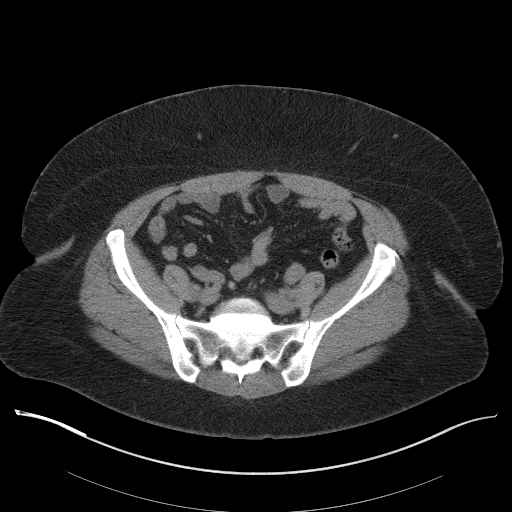
[im 41/85  soft-tissue]
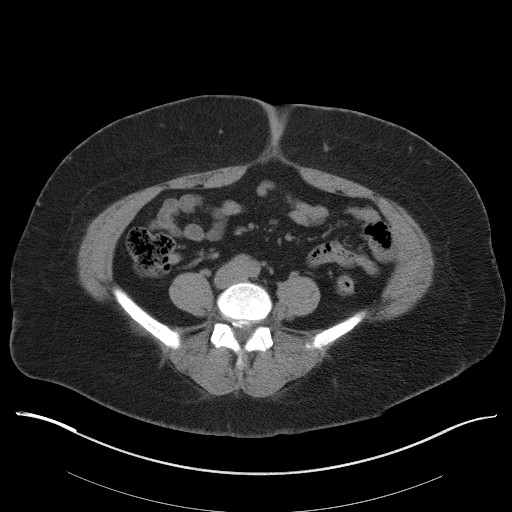
[im 44/85  soft-tissue]
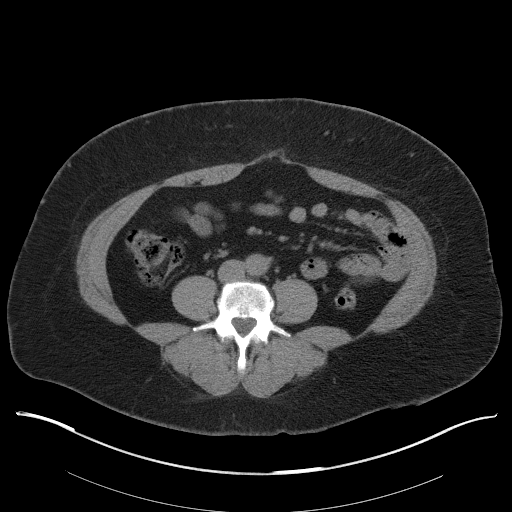
[im 52/85  soft-tissue]
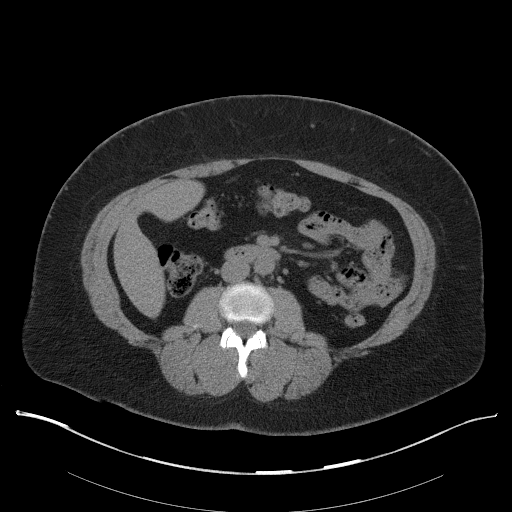
[im 52/85  bone]
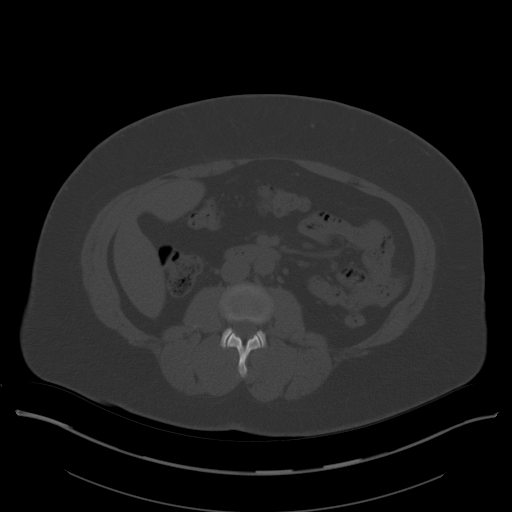
[im 55/85  soft-tissue]
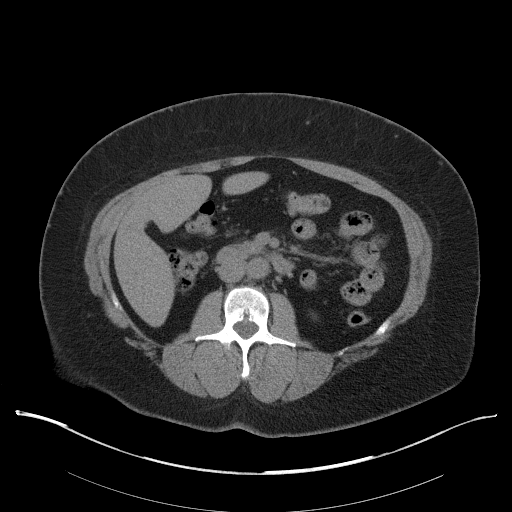
[im 63/85  soft-tissue]
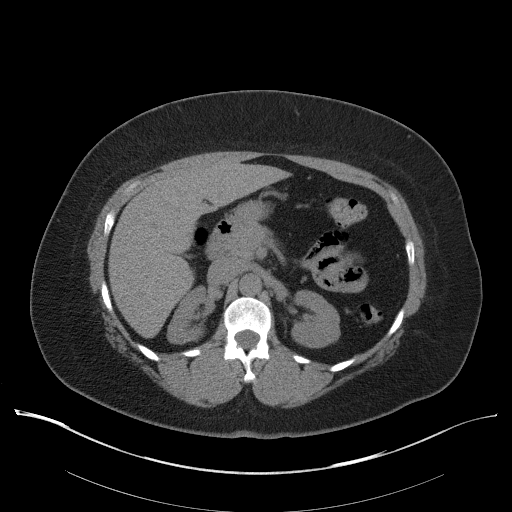
[im 70/85  soft-tissue]
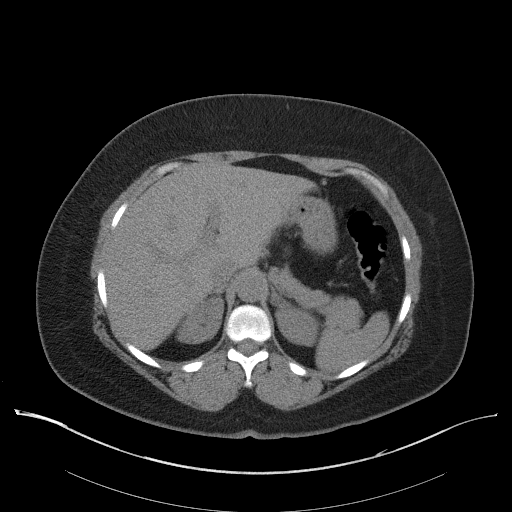
[im 74/85  soft-tissue]
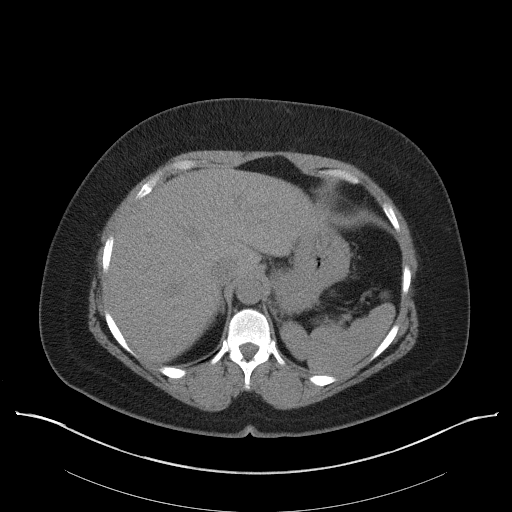
[im 81/85  soft-tissue]
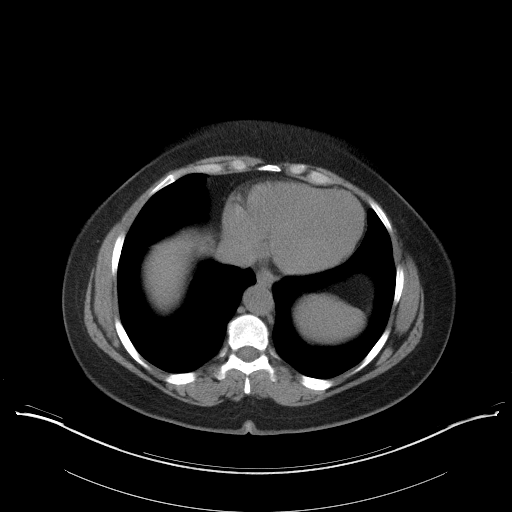

[Series 5: renal stone 3.0 coronal · coronal · 0.89mm/px · 3 of 99 slices shown]
[im 33/99  soft-tissue]
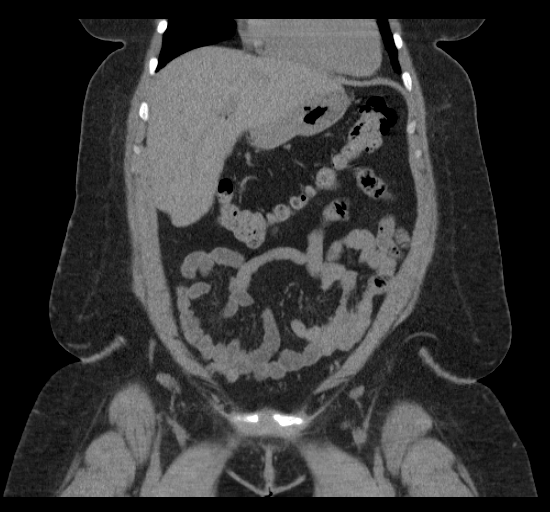
[im 44/99  soft-tissue]
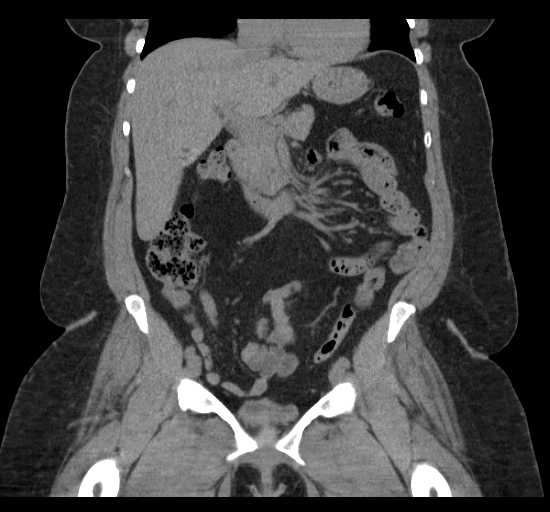
[im 55/99  soft-tissue]
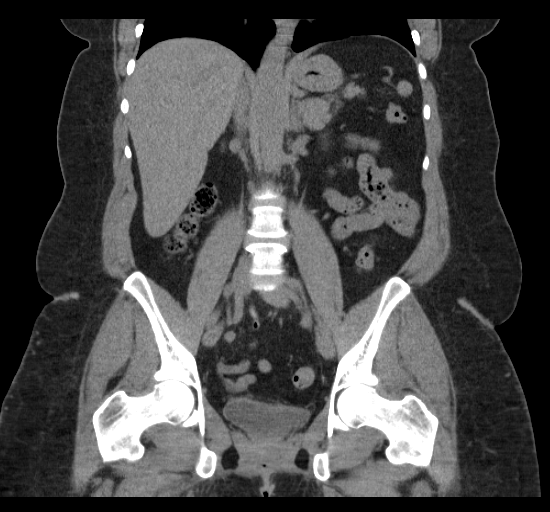

[17 of 46 positions shown; findings below may reference images not displayed]

FINDINGS: Focal atelectasis in the right lung base.

The kidneys appear symmetrical in size and shape.  No
pyelocaliectasis or ureterectasis.  No renal, ureteral, or bladder
stones are visualized.  The bladder is decompressed and cannot be
evaluated for wall thickness.

Surgical absence of the gallbladder.  Small accessory spleen.  The
unenhanced appearance of the liver, spleen, pancreas, adrenal
glands, abdominal aorta, and retroperitoneal lymph nodes is
unremarkable.  The stomach and small bowel are decompressed.  Stool
filled colon without distension.  No free air or free fluid in the
abdomen.  Small midline ventral abdominal hernia containing fat.

Pelvis:  Uterus appears surgically absent.  No abnormal adnexal
masses.  Diverticula in the sigmoid colon without evidence of
diverticulitis.  The appendix is normal.  Degenerative changes in
the lumbar spine.  Suggestion of disc protrusion or bulging annulus
at L4-5.
IMPRESSION: No renal or ureteral stone or obstruction demonstrated.

## 2015-06-26 DIAGNOSIS — M9080 Osteopathy in diseases classified elsewhere, unspecified site: Secondary | ICD-10-CM

## 2015-06-26 DIAGNOSIS — R809 Proteinuria, unspecified: Secondary | ICD-10-CM | POA: Insufficient documentation

## 2015-06-26 DIAGNOSIS — M898X9 Other specified disorders of bone, unspecified site: Secondary | ICD-10-CM | POA: Insufficient documentation

## 2015-06-26 DIAGNOSIS — E889 Metabolic disorder, unspecified: Secondary | ICD-10-CM | POA: Insufficient documentation

## 2016-11-24 ENCOUNTER — Emergency Department (HOSPITAL_BASED_OUTPATIENT_CLINIC_OR_DEPARTMENT_OTHER)
Admission: EM | Admit: 2016-11-24 | Discharge: 2016-11-24 | Disposition: A | Discharge status: Home / Self Care | Payer: BLUE CROSS/BLUE SHIELD | Attending: Emergency Medicine | Admitting: Emergency Medicine

## 2016-11-24 ENCOUNTER — Encounter (HOSPITAL_BASED_OUTPATIENT_CLINIC_OR_DEPARTMENT_OTHER): Payer: Self-pay | Admitting: *Deleted

## 2016-11-24 DIAGNOSIS — J45909 Unspecified asthma, uncomplicated: Secondary | ICD-10-CM | POA: Diagnosis not present

## 2016-11-24 DIAGNOSIS — S93402A Sprain of unspecified ligament of left ankle, initial encounter: Secondary | ICD-10-CM | POA: Diagnosis not present

## 2016-11-24 DIAGNOSIS — Z79899 Other long term (current) drug therapy: Secondary | ICD-10-CM | POA: Insufficient documentation

## 2016-11-24 DIAGNOSIS — X509XXA Other and unspecified overexertion or strenuous movements or postures, initial encounter: Secondary | ICD-10-CM | POA: Diagnosis not present

## 2016-11-24 DIAGNOSIS — Y939 Activity, unspecified: Secondary | ICD-10-CM | POA: Insufficient documentation

## 2016-11-24 DIAGNOSIS — Y999 Unspecified external cause status: Secondary | ICD-10-CM | POA: Diagnosis not present

## 2016-11-24 DIAGNOSIS — I10 Essential (primary) hypertension: Secondary | ICD-10-CM | POA: Diagnosis not present

## 2016-11-24 DIAGNOSIS — M25511 Pain in right shoulder: Secondary | ICD-10-CM | POA: Insufficient documentation

## 2016-11-24 DIAGNOSIS — M7918 Myalgia, other site: Secondary | ICD-10-CM

## 2016-11-24 DIAGNOSIS — Y929 Unspecified place or not applicable: Secondary | ICD-10-CM | POA: Diagnosis not present

## 2016-11-24 MED ORDER — IBUPROFEN 600 MG PO TABS: 600.0000 mg | ORAL_TABLET | Freq: Four times a day (QID) | ORAL | 0 refills | Status: DC | PRN

## 2016-11-24 MED ORDER — METHOCARBAMOL 500 MG PO TABS: 500.0000 mg | ORAL_TABLET | Freq: Two times a day (BID) | ORAL | 0 refills | Status: DC

## 2016-11-24 MED FILL — IBUPROFEN 600 MG TABLET: 600 | 8 days supply | Qty: 30 | Fill #0

## 2016-11-24 MED FILL — METHOCARBAMOL 500 MG TABLET: 500 | 10 days supply | Qty: 20 | Fill #0

## 2016-11-24 NOTE — ED Triage Notes (Signed)
Pt reports 3 months of "pain all over." pt reports right shoulder pain, bilateral hand pain, and bilateral foot pain. Denies any injury.

## 2016-11-24 NOTE — ED Notes (Signed)
Patient given splint instructions.

## 2016-11-24 NOTE — Discharge Instructions (Signed)
Please read and follow all provided instructions.  Your diagnoses today include:  1. Acute pain of right shoulder   2. Sprain of left ankle, unspecified ligament, initial encounter   3. Musculoskeletal pain     Tests performed today include: Vital signs. See below for your results today.   Medications prescribed:  Take as prescribed   Home care instructions:  Follow any educational materials contained in this packet.  Follow-up instructions: Please follow-up with your primary care provider for further evaluation of symptoms and treatment   Return instructions:  Please return to the Emergency Department if you do not get better, if you get worse, or new symptoms OR  - Fever (temperature greater than 101.52F)  - Bleeding that does not stop with holding pressure to the area    -Severe pain (please note that you may be more sore the day after your accident)  - Chest Pain  - Difficulty breathing  - Severe nausea or vomiting  - Inability to tolerate food and liquids  - Passing out  - Skin becoming red around your wounds  - Change in mental status (confusion or lethargy)  - New numbness or weakness    Please return if you have any other emergent concerns.  Additional Information:  Your vital signs today were: BP (!) 133/99 (BP Location: Left Arm)    Pulse 87    Temp 98 F (36.7 C) (Oral)    Resp 18    Ht 5\' 4"  (1.626 m)    Wt 105.2 kg (232 lb)    SpO2 100%    BMI 39.82 kg/m  If your blood pressure (BP) was elevated above 135/85 this visit, please have this repeated by your doctor within one month. ---------------

## 2016-11-24 NOTE — ED Provider Notes (Signed)
[facility] Provider Note   CSN: [id number] Arrival date & time: 11/24/16  0849     History   Chief Complaint No chief complaint on file.   HPI Kathy Elliott is a 51 y.o. female.  HPI  51 y.o. female with a hx of HTN, presents to the Emergency Department today due to right shoulder, bilateral hand, bilateral foot pain x 3 months. Denies trauma or new injuries. Rates pain 1/10. Describes as aching sensation. Most pain is right shoulder and worse with movement and heavy lifting. No pain at rest. States she was at work earlier today and felt pain exacerbated with heavy lifting. States this causes the pain every time. Takes motrin as needed. No numbness/tingling. No CP/SOB. No N/V. No other symptoms noted.     Past Medical History:  Diagnosis Date  . Asthma   . Hypertension     Patient Active Problem List   Diagnosis Date Noted  . Low back pain 11/05/2012    Past Surgical History:  Procedure Laterality Date  . ABDOMINAL HYSTERECTOMY    . BREAST REDUCTION SURGERY    . CHOLECYSTECTOMY      OB History    No data available       Home Medications    Prior to Admission medications   Medication Sig Start Date End Date Taking? Authorizing Provider  albuterol (PROVENTIL HFA;VENTOLIN HFA) 108 (90 BASE) MCG/ACT inhaler Inhale 2 puffs into the lungs every 6 (six) hours as needed. Patient uses this medication for shortness of breath and prn.    [provider]  amLODipine-olmesartan (AZOR) 10-40 MG per tablet Take 1 tablet by mouth daily.    [provider]  aspirin-acetaminophen-caffeine (EXCEDRIN MIGRAINE) [phone] MG per tablet Take 2 tablets by mouth every 6 (six) hours as needed. Patient used this medication for her headache.    [provider]  azithromycin (ZITHROMAX Z-PAK) 250 MG tablet Two tablets 1st day then one tablet a day, days 2-5 12/16/12   Fransico Meadow, PA-C  Chlorphen-Phenyleph-ASA (ALKA-SELTZER PLUS COLD PO) Take 2  tablets by mouth daily as needed. Patient used this medication for her cold.    [provider]  famotidine (PEPCID) 20 MG tablet Take 1 tablet (20 mg total) by mouth 2 (two) times daily. 09/15/11 09/19/12  Carmin Muskrat, MD  loratadine (CLARITIN) 10 MG tablet Take 1 tablet (10 mg total) by mouth daily. 09/16/11 09/19/12  Carmin Muskrat, MD  Menthol, Topical Analgesic, (BIOFREEZE EX) Apply 1 application topically daily as needed. Patient used this medication for her shoulder.    [provider]  oxyCODONE-acetaminophen (PERCOCET) 5-325 MG per tablet Take 1-2 tablets by mouth every 6 (six) hours as needed for pain. 10/25/12   Molpus, John, MD  predniSONE (DELTASONE) 20 MG tablet 3 tablets once a day 12/16/12   Fransico Meadow, PA-C    Family History Family History  Problem Relation Age of Onset  . Hypertension Mother   . Diabetes Neg Hx   . Heart attack Neg Hx   . Hyperlipidemia Neg Hx   . Sudden death Neg Hx     Social History Social History  Substance Use Topics  . Smoking status: Never Smoker  . Smokeless tobacco: Not on file  . Alcohol use No     Allergies   Amoxicillin and Vicodin [hydrocodone-acetaminophen]   Review of Systems Review of Systems  Constitutional: Negative for fever.  Respiratory: Negative for shortness of breath.   Cardiovascular: Negative for chest  pain.  Gastrointestinal: Negative for nausea.  Musculoskeletal: Positive for arthralgias and myalgias.  Skin: Negative for wound.  Allergic/Immunologic: Negative for immunocompromised state.   Physical Exam Updated Vital Signs BP (!) 133/99 (BP Location: Left Arm)   Pulse 87   Temp 98 F (36.7 C) (Oral)   Resp 18   Ht 5\' 4"  (1.626 m)   Wt 105.2 kg (232 lb)   SpO2 100%   BMI 39.82 kg/m   Physical Exam  Constitutional: She is oriented to person, place, and time. Vital signs are normal. She appears well-developed and well-nourished.  HENT:  Head: Normocephalic.  Right Ear: Hearing  normal.  Left Ear: Hearing normal.  Eyes: Pupils are equal, round, and reactive to light. Conjunctivae and EOM are normal.  Cardiovascular: Normal rate and regular rhythm.   Pulmonary/Chest: Effort normal.  Musculoskeletal:  Right Shoulder: Pain with external rotation. TTP anterior shoulder joint. No swelling or erythema. Abduction/adductionion intact. NVI. Distal pulses appreciated. Left ankle TTP below medial malleolus. No swelling. No erythema. Pain with inversion of ankle.  Neurological: She is alert and oriented to person, place, and time.  Skin: Skin is warm and dry.  Psychiatric: She has a normal mood and affect. Her speech is normal and behavior is normal. Thought content normal.  Nursing note and vitals reviewed.    ED Treatments / Results  Labs (all labs ordered are listed, but only abnormal results are displayed) Labs Reviewed - No data to display  EKG  EKG Interpretation None       Radiology No results found.  Procedures Procedures (including critical care time)  Medications Ordered in ED Medications - No data to display   Initial Impression / Assessment and Plan / ED Course  I have reviewed the triage vital signs and the nursing notes.  Pertinent labs & imaging results that were available during my care of the patient were reviewed by me and considered in my medical decision making (see chart for details).  Final Clinical Impressions(s) / ED Diagnoses     {I have reviewed the relevant previous healthcare records.  {I obtained HPI from historian.   ED Course:  Assessment: Pt is a 51 y.o. female with a hx of HTN, presents to the Emergency Department today due to right shoulder, bilateral hand, bilateral foot pain x 3 months. Denies trauma or new injuries. Rates pain 1/10. Describes as aching sensation. Most pain is right shoulder and worse with movement and heavy lifting. No pain at rest. States she was at work earlier today and felt pain exacerbated with  heavy lifting. States this causes the pain every time. Takes motrin as needed. No numbness/tingling. No CP/SOB. No N/V. On exam, pt in NAD. Nontoxic/nonseptic appearing. VSS. Afebrile. Lungs CTA. Heart RRR. Abdomen nontender soft. Right shoulder exam with suspect rotator cuff sprain. Doubt tear given exam. Ankle with suspect ankle sprain. These injuries occur 2/2 heavy lifting and resolve with rest. Imaging not indicated given no direct trauma and symptoms >3 months. Encouraged stretching exercises as well as NSAIDs. Plan is to [place]. Strict return precautions given.. At time of discharge, Patient is in no acute distress. Vital Signs are stable. Patient is able to ambulate. Patient able to tolerate PO.   Disposition/Plan:  DC Home Additional Verbal discharge instructions given and discussed with patient.  Pt Instructed to f/u with Ortho as pt without PCP for evaluation and treatment of symptoms. Given PCP referral.  Return precautions given Pt acknowledges and agrees with plan  Supervising Physician Alfonzo Beers, MD  Final diagnoses:  Acute pain of right shoulder  Sprain of left ankle, unspecified ligament, initial encounter  Musculoskeletal pain    New Prescriptions New Prescriptions   No medications on file     Shary Decamp, Hershal Coria 11/24/16 3825    Alfonzo Beers, MD 11/24/16 [phone]

## 2017-01-20 ENCOUNTER — Emergency Department (HOSPITAL_BASED_OUTPATIENT_CLINIC_OR_DEPARTMENT_OTHER)
Admission: EM | Admit: 2017-01-20 | Discharge: 2017-01-20 | Disposition: A | Discharge status: Home / Self Care | Payer: BLUE CROSS/BLUE SHIELD | Attending: Emergency Medicine | Admitting: Emergency Medicine

## 2017-01-20 ENCOUNTER — Encounter (HOSPITAL_BASED_OUTPATIENT_CLINIC_OR_DEPARTMENT_OTHER): Payer: Self-pay | Admitting: *Deleted

## 2017-01-20 ENCOUNTER — Emergency Department (HOSPITAL_BASED_OUTPATIENT_CLINIC_OR_DEPARTMENT_OTHER): Payer: BLUE CROSS/BLUE SHIELD

## 2017-01-20 DIAGNOSIS — M25572 Pain in left ankle and joints of left foot: Secondary | ICD-10-CM | POA: Diagnosis present

## 2017-01-20 DIAGNOSIS — I10 Essential (primary) hypertension: Secondary | ICD-10-CM | POA: Insufficient documentation

## 2017-01-20 DIAGNOSIS — J45909 Unspecified asthma, uncomplicated: Secondary | ICD-10-CM | POA: Diagnosis not present

## 2017-01-20 DIAGNOSIS — Z79899 Other long term (current) drug therapy: Secondary | ICD-10-CM | POA: Insufficient documentation

## 2017-01-20 DIAGNOSIS — M79672 Pain in left foot: Secondary | ICD-10-CM | POA: Diagnosis not present

## 2017-01-20 HISTORY — DX: Disorder of kidney and ureter, unspecified: N28.9

## 2017-01-20 NOTE — ED Provider Notes (Signed)
[facility] Provider Note   CSN: [id number] Arrival date & time: 01/20/17  [place]     History   Chief Complaint Chief Complaint  Patient presents with  . Ankle Pain    HPI Kathy Elliott is a 51 y.o. female w PMHx CKD, HTN, asthma, presenting to the ED with intermittent left ankle pain for >1 month. Pt states she was seen in July for similar complaint with conservative tx. Localizes pain to left dorsal foot that is worse with ambulation and improved with rest. Has not been taking medication for pain as she was instructed by her PCP not to take advil d/t kidney function. Pt denies known injury, fever, N/T, hx blood clot, hx gout.  The history is provided by the patient.    Past Medical History:  Diagnosis Date  . Asthma   . Hypertension   . Renal disorder    chronic kidney disease    Patient Active Problem List   Diagnosis Date Noted  . Low back pain 11/05/2012    Past Surgical History:  Procedure Laterality Date  . ABDOMINAL HYSTERECTOMY    . BREAST REDUCTION SURGERY    . CHOLECYSTECTOMY      OB History    No data available       Home Medications    Prior to Admission medications   Medication Sig Start Date End Date Taking? Authorizing Provider  albuterol (PROVENTIL HFA;VENTOLIN HFA) 108 (90 BASE) MCG/ACT inhaler Inhale 2 puffs into the lungs every 6 (six) hours as needed. Patient uses this medication for shortness of breath and prn.    [provider]  aspirin-acetaminophen-caffeine (EXCEDRIN MIGRAINE) [phone] MG per tablet Take 2 tablets by mouth every 6 (six) hours as needed. Patient used this medication for her headache.    [provider]  Chlorphen-Phenyleph-ASA (ALKA-SELTZER PLUS COLD PO) Take 2 tablets by mouth daily as needed. Patient used this medication for her cold.    [provider]  ibuprofen (ADVIL,MOTRIN) 600 MG tablet Take 1 tablet (600 mg total) by mouth every 6 (six) hours as needed. 11/24/16   Shary Decamp,  PA-C  LISINOPRIL PO Take by mouth.    [provider]  loratadine (CLARITIN) 10 MG tablet Take 1 tablet (10 mg total) by mouth daily. 09/16/11 09/19/12  Carmin Muskrat, MD  Menthol, Topical Analgesic, (BIOFREEZE EX) Apply 1 application topically daily as needed. Patient used this medication for her shoulder.    [provider]  methocarbamol (ROBAXIN) 500 MG tablet Take 1 tablet (500 mg total) by mouth 2 (two) times daily. 11/24/16   Shary Decamp, PA-C  NIFEDIPINE PO Take by mouth.    [provider]    Family History Family History  Problem Relation Age of Onset  . Hypertension Mother   . Diabetes Neg Hx   . Heart attack Neg Hx   . Hyperlipidemia Neg Hx   . Sudden death Neg Hx     Social History Social History  Substance Use Topics  . Smoking status: Never Smoker  . Smokeless tobacco: Never Used  . Alcohol use No     Allergies   Amoxicillin and Vicodin [hydrocodone-acetaminophen]   Review of Systems Review of Systems  Constitutional: Negative for fever.  Musculoskeletal: Positive for arthralgias.  Skin: Negative for color change.  Neurological: Negative for numbness.     Physical Exam Updated Vital Signs BP (!) 135/98   Pulse 94   Temp 98.2 F (36.8 C) (Oral)   Resp 20  Ht 5\' 4"  (1.626 m)   Wt 104.3 kg (230 lb)   SpO2 98%   BMI 39.48 kg/m   Physical Exam  Constitutional: She appears well-developed and well-nourished. No distress.  HENT:  Head: Normocephalic and atraumatic.  Eyes: Conjunctivae are normal.  Cardiovascular: Normal rate and intact distal pulses.   Pulmonary/Chest: Effort normal.  Musculoskeletal:  Left ankle/foot without edema, deformity or color changes. TTP noted over plantar aspect of foot in arch. No other tenderness noted. Full AROM. No warmth or erythema.  Neurological:  Normal sensation  Psychiatric: She has a normal mood and affect. Her behavior is normal.  Nursing note and vitals reviewed.    ED  Treatments / Results  Labs (all labs ordered are listed, but only abnormal results are displayed) Labs Reviewed - No data to display  EKG  EKG Interpretation None       Radiology Dg Ankle Complete Left  Result Date: 01/20/2017 CLINICAL DATA:  Left ankle pain for 1 month.  No known injury. EXAM: LEFT ANKLE COMPLETE - 3+ VIEW COMPARISON:  None. FINDINGS: No acute fracture or dislocation is identified. A moderate-sized plantar calcaneal enthesophyte is noted. Mild spurring is noted along the dorsal midfoot. There is mild soft tissue swelling about the ankle. IMPRESSION: No acute osseous abnormality identified. Electronically Signed   By: Logan Bores M.D.   On: 01/20/2017 19:35    Procedures Procedures (including critical care time)  Medications Ordered in ED Medications - No data to display   Initial Impression / Assessment and Plan / ED Course  I have reviewed the triage vital signs and the nursing notes.  Pertinent labs & imaging results that were available during my care of the patient were reviewed by me and considered in my medical decision making (see chart for details).     Pt with ongoing intermittent Left foot pain, suspect plantar fasciitis vs tendonitis. Foot and ankle with nl ROM, no erythema or warmth. NV intact. Xray with chronic changes, no acute pathology. No indication of DVT, due to chronicity of symptoms and benign exam. Orthopedic referral given. Symptomatic management discussed. Pt is safe for discharge.  Discussed results, findings, treatment and follow up. Patient advised of return precautions. Patient verbalized understanding and agreed with plan.   Final Clinical Impressions(s) / ED Diagnoses   Final diagnoses:  Left foot pain    New Prescriptions Discharge Medication List as of 01/20/2017  8:13 PM       Russo, [place] N, PA-C 01/20/17 2029    Julianne Rice, MD 01/22/17 [phone]

## 2017-01-20 NOTE — ED Notes (Signed)
Pt c/o chronic left ankle pain.  She was seen last month and given a referral on her dc papers for sports medicine, did not follow up bc she "didn't know."

## 2017-01-20 NOTE — ED Triage Notes (Signed)
Pain and swelling in her left ankle x 3 days. No known injury.

## 2017-01-20 NOTE — Discharge Instructions (Signed)
Please read instructions below. Apply ice to your foot for 20 minutes at a time. Elevate it when possible. You can take tylenol every 6 hours as needed for pain. Schedule an appointment with the orthopedic specialist in 1 week for follow-up on your foot pain. Return to the ER for new or concerning symptoms.

## 2017-01-20 NOTE — ED Notes (Signed)
Pt verbalizes understanding of d/c instructions and denies any further needs at this time. 

## 2017-02-03 ENCOUNTER — Ambulatory Visit (INDEPENDENT_AMBULATORY_CARE_PROVIDER_SITE_OTHER): Payer: BLUE CROSS/BLUE SHIELD | Admitting: Family Medicine

## 2017-02-03 ENCOUNTER — Encounter: Payer: Self-pay | Admitting: Family Medicine

## 2017-02-03 DIAGNOSIS — M25572 Pain in left ankle and joints of left foot: Secondary | ICD-10-CM | POA: Diagnosis not present

## 2017-02-03 NOTE — Patient Instructions (Addendum)
You have extensor and posterior tibialis tendinitis/tenosynovitis. Ice the area for 15 minutes at a time, 3-4 times a day Tylenol as needed for pain. Capsaicin, biofreeze, or salon pas topically to help with pain. Elevate above the level of your heart when possible Bear weight when tolerated Laceup brace that you have at home for support. Arch supports are very important (dr. Zoe Lan active series, our green insoles). Avoid flat shoes, barefoot walking as much as possible. Start theraband strengthening exercises once a day 3 sets of 10 each direction Consider physical therapy for strengthening and balance exercises. A walking boot is an option if you're struggling too. If not improving as expected, we may repeat x-rays or consider further testing like an MRI. Follow up in 6 weeks for reevaluation.

## 2017-02-07 DIAGNOSIS — M25572 Pain in left ankle and joints of left foot: Secondary | ICD-10-CM | POA: Insufficient documentation

## 2017-02-07 NOTE — Assessment & Plan Note (Signed)
2/2 posterior tibialis and extensor digitorum tendinitis/tenosynovitis.  Icing, tylenol as needed, topical medications.  Elevation.  Stressed importance of arch support.  Avoid flat shoes, barefoot walking.  Shown home exercises to do daily with theraband.  Consider walking boot if struggling, physical therapy.  F/u in 6 weeks.

## 2017-02-07 NOTE — Progress Notes (Signed)
PCP: [facility], [facility]  Subjective:   HPI: Patient is a 51 y.o. female here for left ankle pain.  Patient reports she's had about 2-3 months of medial left ankle pain. No acute injury or trauma. Pain level 4/10 and sharp, can get up to 10/10 level. Stiff in the morning. Associated burning sensation with the pain. Worse after on feet a lot at work. Taking tylenol which helps some. No skin changes, numbness.  Past Medical History:  Diagnosis Date  . Asthma   . Hypertension   . Renal disorder    chronic kidney disease    Current Outpatient Prescriptions on File Prior to Visit  Medication Sig Dispense Refill  . albuterol (PROVENTIL HFA;VENTOLIN HFA) 108 (90 BASE) MCG/ACT inhaler Inhale 2 puffs into the lungs every 6 (six) hours as needed. Patient uses this medication for shortness of breath and prn.    . aspirin-acetaminophen-caffeine (EXCEDRIN MIGRAINE) 250-250-65 MG per tablet Take 2 tablets by mouth every 6 (six) hours as needed. Patient used this medication for her headache.    . ibuprofen (ADVIL,MOTRIN) 600 MG tablet Take 1 tablet (600 mg total) by mouth every 6 (six) hours as needed. 30 tablet 0  . methocarbamol (ROBAXIN) 500 MG tablet Take 1 tablet (500 mg total) by mouth 2 (two) times daily. 20 tablet 0   No current facility-administered medications on file prior to visit.     Past Surgical History:  Procedure Laterality Date  . ABDOMINAL HYSTERECTOMY    . BREAST REDUCTION SURGERY    . CHOLECYSTECTOMY      Allergies  Allergen Reactions  . Amoxicillin Hives  . Vicodin [Hydrocodone-Acetaminophen] Nausea And Vomiting    Social History   Social History  . Marital status: Married    Spouse name: N/A  . Number of children: N/A  . Years of education: N/A   Occupational History  . Not on file.   Social History Main Topics  . Smoking status: Never Smoker  . Smokeless tobacco: Never Used  . Alcohol use No  . Drug use:  No  . Sexual activity: Not on file   Other Topics Concern  . Not on file   Social History Narrative  . No narrative on file    Family History  Problem Relation Age of Onset  . Hypertension Mother   . Diabetes Neg Hx   . Heart attack Neg Hx   . Hyperlipidemia Neg Hx   . Sudden death Neg Hx     BP (!) 132/98   Pulse 93   Ht 5\' 4"  (1.626 m)   Wt 230 lb (104.3 kg)   BMI 39.48 kg/m   Review of Systems: See HPI above.     Objective:  Physical Exam:  Gen: NAD, comfortable in exam room  Left ankle/foot: No gross deformity, swelling, ecchymoses FROM ankle with mild pain on internal rotation, plantarflexion. TTP over posterior tibialis tendon and extensor digitorum.  No bony or other tenderness. Negative ant drawer and talar tilt.   Negative syndesmotic compression. Thompsons test negative. NV intact distally.  Right foot/ankle: FROM without pain.   Assessment & Plan:  1. Left ankle pain - 2/2 posterior tibialis and extensor digitorum tendinitis/tenosynovitis.  Icing, tylenol as needed, topical medications.  Elevation.  Stressed importance of arch support.  Avoid flat shoes, barefoot walking.  Shown home exercises to do daily with theraband.  Consider walking boot if struggling, physical therapy.  F/u in 6 weeks.

## 2017-03-01 ENCOUNTER — Telehealth: Payer: Self-pay | Admitting: Family Medicine

## 2017-03-01 NOTE — Telephone Encounter (Signed)
Spoke to patient and she wanted to do physical therapy. Order sent downstairs.

## 2017-03-01 NOTE — Addendum Note (Signed)
Addended by: Sherrie George F on: 03/01/2017 04:45 PM   Modules accepted: Orders

## 2017-03-01 NOTE — Telephone Encounter (Signed)
We discussed trial of walking boot and/or physical therapy.

## 2017-03-01 NOTE — Telephone Encounter (Signed)
Patient called saying she is not improving, seems to be getting worse. She said she is now experiencing pain in her right ankle as well

## 2017-03-07 ENCOUNTER — Ambulatory Visit: Payer: BLUE CROSS/BLUE SHIELD | Admitting: Physical Therapy

## 2017-03-17 ENCOUNTER — Ambulatory Visit: Payer: BLUE CROSS/BLUE SHIELD | Admitting: Family Medicine

## 2017-08-15 ENCOUNTER — Encounter (HOSPITAL_BASED_OUTPATIENT_CLINIC_OR_DEPARTMENT_OTHER): Payer: Self-pay | Admitting: Emergency Medicine

## 2017-08-15 ENCOUNTER — Other Ambulatory Visit: Payer: Self-pay

## 2017-08-15 ENCOUNTER — Emergency Department (HOSPITAL_BASED_OUTPATIENT_CLINIC_OR_DEPARTMENT_OTHER)
Admission: EM | Admit: 2017-08-15 | Discharge: 2017-08-15 | Disposition: A | Discharge status: Home / Self Care | Payer: BLUE CROSS/BLUE SHIELD | Attending: Emergency Medicine | Admitting: Emergency Medicine

## 2017-08-15 DIAGNOSIS — Z79899 Other long term (current) drug therapy: Secondary | ICD-10-CM | POA: Diagnosis not present

## 2017-08-15 DIAGNOSIS — J45909 Unspecified asthma, uncomplicated: Secondary | ICD-10-CM | POA: Insufficient documentation

## 2017-08-15 DIAGNOSIS — I129 Hypertensive chronic kidney disease with stage 1 through stage 4 chronic kidney disease, or unspecified chronic kidney disease: Secondary | ICD-10-CM | POA: Insufficient documentation

## 2017-08-15 DIAGNOSIS — R21 Rash and other nonspecific skin eruption: Secondary | ICD-10-CM

## 2017-08-15 DIAGNOSIS — N189 Chronic kidney disease, unspecified: Secondary | ICD-10-CM | POA: Diagnosis not present

## 2017-08-15 MED ORDER — HYDROXYZINE HCL 25 MG PO TABS: 25.0000 mg | ORAL_TABLET | Freq: Four times a day (QID) | ORAL | 0 refills | Status: DC | PRN

## 2017-08-15 MED ORDER — PREDNISONE 20 MG PO TABS: ORAL_TABLET | ORAL | 0 refills | Status: DC

## 2017-08-15 MED FILL — hydrOXYzine HCL 25 MG TABS: 25 | 3 days supply | Qty: 12 | Fill #0

## 2017-08-15 MED FILL — predniSONE 20 MG TABS: 20 | 5 days supply | Qty: 11 | Fill #0

## 2017-08-15 NOTE — ED Triage Notes (Signed)
Raised, Pruritic rash x2 days.  Started on right arm but now is all over body.  Sts she had a similar reaction to Amoxicillin but has not taken any new meds of any sort to cause this.

## 2017-08-15 NOTE — ED Provider Notes (Signed)
[place] EMERGENCY DEPARTMENT Provider Note   CSN: [id number] Arrival date & time: 08/15/17  7829     History   Chief Complaint Chief Complaint  Patient presents with  . Rash    HPI Kathy Elliott is a 52 y.o. female.  HPI Patient presents with gradual onset pruritic rash for the past 2 days.  States the rash started on her right upper arm and has since spread to both arms and both legs.  Denies any known exposures.  Patient's been taking colchicine and allopurinol for the last 2 weeks for gout in her feet.  No other new medications.  No close contacts with similar rash.  Denies fever or chills.  Denies facial or intraoral swelling.  No difficulty breathing or wheezing.  Denies any new cleaning products or lotions.  Has been using Benadryl cream with some relief of itching. Past Medical History:  Diagnosis Date  . Asthma   . Hypertension   . Renal disorder    chronic kidney disease    Patient Active Problem List   Diagnosis Date Noted  . Left ankle pain 02/07/2017  . Proteinuria 06/26/2015  . Metabolic bone disease 56/21/3086  . 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 Abnormal brain MRI 03/25/2014  . Chronic renal failure 03/21/2014  . Low back pain 11/05/2012  . Hyperlipidemia 10/20/2012  . Essential hypertension 10/20/2012  . Allergic rhinitis 10/20/2012  . Asthma 08/16/2012    Past Surgical History:  Procedure Laterality Date  . ABDOMINAL HYSTERECTOMY    . BREAST REDUCTION SURGERY    . CHOLECYSTECTOMY       OB History   None      Home Medications    Prior to Admission medications   Medication Sig Start Date End Date Taking? Authorizing Provider  allopurinol (ZYLOPRIM) 100 MG tablet Take 200 mg by mouth daily.   Yes [provider]  colchicine 0.6 MG tablet Take 0.6 mg by mouth daily.   Yes [provider]  albuterol (PROVENTIL HFA;VENTOLIN HFA) 108 (90 BASE) MCG/ACT inhaler Inhale 2 puffs into the  lungs every 6 (six) hours as needed. Patient uses this medication for shortness of breath and prn.    [provider]  aspirin-acetaminophen-caffeine (EXCEDRIN MIGRAINE) [phone] MG per tablet Take 2 tablets by mouth every 6 (six) hours as needed. Patient used this medication for her headache.    [provider]  furosemide (LASIX) 20 MG tablet  01/19/17   [provider]  hydrOXYzine (ATARAX/VISTARIL) 25 MG tablet Take 1 tablet (25 mg total) by mouth every 6 (six) hours as needed for itching. 08/15/17   Julianne Rice, MD  ibuprofen (ADVIL,MOTRIN) 600 MG tablet Take 1 tablet (600 mg total) by mouth every 6 (six) hours as needed. 11/24/16   Shary Decamp, PA-C  lisinopril (PRINIVIL,ZESTRIL) 20 MG tablet  01/20/17   [provider]  methocarbamol (ROBAXIN) 500 MG tablet Take 1 tablet (500 mg total) by mouth 2 (two) times daily. 11/24/16   Shary Decamp, PA-C  NIFEdipine (PROCARDIA XL/ADALAT-CC) 90 MG 24 hr tablet  01/09/17   [provider]  predniSONE (DELTASONE) 20 MG tablet 3 tabs po day one, then 2 po daily x 4 days 08/15/17   Julianne Rice, MD  Vitamin D, Ergocalciferol, (DRISDOL) 50000 units CAPS capsule  12/23/16   [provider]    Family History Family History  Problem Relation Age of Onset  . Hypertension Mother   . Diabetes Neg Hx   .  Heart attack Neg Hx   . Hyperlipidemia Neg Hx   . Sudden death Neg Hx     Social History Social History   Tobacco Use  . Smoking status: Never Smoker  . Smokeless tobacco: Never Used  Substance Use Topics  . Alcohol use: No  . Drug use: No     Allergies   Amoxicillin and Vicodin [hydrocodone-acetaminophen]   Review of Systems Review of Systems  Constitutional: Negative for chills and fever.  HENT: Negative for facial swelling, sore throat and trouble swallowing.   Respiratory: Negative for shortness of breath.   Cardiovascular: Negative for chest pain, palpitations and leg swelling.    Gastrointestinal: Negative for abdominal pain, nausea and vomiting.  Musculoskeletal: Negative for arthralgias, myalgias, neck pain and neck stiffness.  Skin: Positive for rash. Negative for wound.  Neurological: Negative for weakness, numbness and headaches.  All other systems reviewed and are negative.    Physical Exam Updated Vital Signs BP (!) 147/106 (BP Location: Right Arm) Comment: Has not taken bp med yet this morning.  Pulse 83   Temp 98.4 F (36.9 C) (Oral)   Resp 16   Ht 5\' 4"  (1.626 m)   Wt 99.3 kg (219 lb)   SpO2 100%   BMI 37.59 kg/m   Physical Exam  Constitutional: She is oriented to person, place, and time. She appears well-developed and well-nourished.  HENT:  Head: Normocephalic and atraumatic.  Mouth/Throat: Oropharynx is clear and moist.  No intraoral swelling  Eyes: Pupils are equal, round, and reactive to light. EOM are normal.  Neck: Normal range of motion. Neck supple.  No stridor  Cardiovascular: Normal rate and regular rhythm.  Pulmonary/Chest: Effort normal and breath sounds normal. No stridor. No respiratory distress. She has no wheezes. She has no rales. She exhibits no tenderness.  Abdominal: Soft. Bowel sounds are normal. There is no tenderness. There is no rebound and no guarding.  Musculoskeletal: Normal range of motion. She exhibits no edema or tenderness.  Neurological: She is alert and oriented to person, place, and time.  Skin: Skin is warm and dry. Rash noted. No erythema.  Patient with erythematous papular rash to extensor surfaces of the bilateral upper and lower extremities.  No interdigital lesions appreciated.  Psychiatric: She has a normal mood and affect. Her behavior is normal.  Nursing note and vitals reviewed.    ED Treatments / Results  Labs (all labs ordered are listed, but only abnormal results are displayed) Labs Reviewed - No data to display  EKG None  Radiology No results found.  Procedures Procedures  (including critical care time)  Medications Ordered in ED Medications - No data to display   Initial Impression / Assessment and Plan / ED Course  I have reviewed the triage vital signs and the nursing notes.  Pertinent labs & imaging results that were available during my care of the patient were reviewed by me and considered in my medical decision making (see chart for details).     Patient with erythematous, pruritic rash to the arms and legs.  Questionable atopic dermatitis.  Lower suspicion for insect bites or scabies or allergic reaction.  Will treat with short course of prednisone and Vistaril as needed for itching.  Patient's been given strict return precautions and is voiced understanding.  Final Clinical Impressions(s) / ED Diagnoses   Final diagnoses:  Rash    ED Discharge Orders        Ordered    predniSONE (DELTASONE) 20 MG  tablet     08/15/17 0914    hydrOXYzine (ATARAX/VISTARIL) 25 MG tablet  Every 6 hours PRN     08/15/17 0914       Julianne Rice, MD 08/15/17 [phone]

## 2019-04-09 ENCOUNTER — Encounter (HOSPITAL_BASED_OUTPATIENT_CLINIC_OR_DEPARTMENT_OTHER): Payer: Self-pay

## 2019-04-09 ENCOUNTER — Other Ambulatory Visit: Payer: Self-pay

## 2019-04-09 ENCOUNTER — Emergency Department (HOSPITAL_BASED_OUTPATIENT_CLINIC_OR_DEPARTMENT_OTHER)
Admission: EM | Admit: 2019-04-09 | Discharge: 2019-04-09 | Disposition: A | Discharge status: Home / Self Care | Payer: BLUE CROSS/BLUE SHIELD | Attending: Emergency Medicine | Admitting: Emergency Medicine

## 2019-04-09 DIAGNOSIS — Z79899 Other long term (current) drug therapy: Secondary | ICD-10-CM | POA: Insufficient documentation

## 2019-04-09 DIAGNOSIS — I10 Essential (primary) hypertension: Secondary | ICD-10-CM | POA: Insufficient documentation

## 2019-04-09 DIAGNOSIS — H66002 Acute suppurative otitis media without spontaneous rupture of ear drum, left ear: Secondary | ICD-10-CM | POA: Insufficient documentation

## 2019-04-09 DIAGNOSIS — J45909 Unspecified asthma, uncomplicated: Secondary | ICD-10-CM | POA: Insufficient documentation

## 2019-04-09 MED ORDER — DOXYCYCLINE HYCLATE 100 MG PO CAPS: 100.0000 mg | ORAL_CAPSULE | Freq: Two times a day (BID) | ORAL | 0 refills | Status: AC

## 2019-04-09 MED ORDER — DOXYCYCLINE HYCLATE 100 MG PO TABS
100.0000 mg | ORAL_TABLET | Freq: Once | ORAL | Status: AC
  Administered 2019-04-09: 100 mg via ORAL
  Filled 2019-04-09: qty 1

## 2019-04-09 NOTE — Discharge Instructions (Addendum)
You have been diagnosed today with Left Ear Infection.  At this time there does not appear to be the presence of an emergent medical condition, however there is always the potential for conditions to change. Please read and follow the below instructions.  Please return to the Emergency Department immediately for any new or worsening symptoms. Please be sure to follow up with your Primary Care Provider within one week regarding your visit today; please call their office to schedule an appointment even if you are feeling better for a follow-up visit. You appear to have an ear infection of your left ear.  Please take the antibiotic doxycycline as prescribed twice daily for the next 7 days for treatment.  Your symptoms should begin to improve in the next 2 days.  I advise that you follow-up with your primary care provider in 5 days for recheck. Additionally please call the ear nose and throat specialist Dr. Erik Obey on your discharge paperwork to schedule a follow-up appointment for further evaluation of your left ear infection.  Get help right away if: You have pain that is not helped with medicine. You have swelling, redness, or pain around your ear. You get a stiff neck. You cannot move part of your face (paralyzed). You notice that the bone behind your ear hurts when you touch it. You get a very bad headache. You have any new/concerning or worsening of symptoms  Please read the additional information packets attached to your discharge summary.  Do not take your medicine if  develop an itchy rash, swelling in your mouth or lips, or difficulty breathing; call 911 and seek immediate emergency medical attention if this occurs.  Note: Portions of this text may have been transcribed using voice recognition software. Every effort was made to ensure accuracy; however, inadvertent computerized transcription errors may still be present.

## 2019-04-09 NOTE — ED Provider Notes (Signed)
[facility] EMERGENCY DEPARTMENT Provider Note   CSN: [id number] Arrival date & time: 04/09/19  1723     History   Chief Complaint Chief Complaint  Patient presents with  . Otalgia    HPI Kathy Elliott is a 53 y.o. female history asthma, hypertension, CKD.  Patient reports 2-day history of left ear pain, throbbing constant no clear aggravating or alleviating factors, no meds prior to arrival, no radiation of pain.  Denies similar in the past.  Denies recent illness.  Denies fever/chills, headache, vision changes, neck pain/stiffness, chest pain/shortness of breath, abdominal pain, nausea/vomiting, diarrhea, swelling of the face head or neck or any additional concerns today.     HPI  Past Medical History:  Diagnosis Date  . Asthma   . Hypertension   . Renal disorder    chronic kidney disease    Patient Active Problem List   Diagnosis Date Noted  . Left ankle pain 02/07/2017  . Proteinuria 06/26/2015  . Metabolic bone disease [id number]  . 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 Abnormal brain MRI 03/25/2014  . Chronic renal failure 03/21/2014  . Low back pain 11/05/2012  . Hyperlipidemia 10/20/2012  . Essential hypertension 10/20/2012  . Allergic rhinitis 10/20/2012  . Asthma 08/16/2012    Past Surgical History:  Procedure Laterality Date  . ABDOMINAL HYSTERECTOMY    . BREAST REDUCTION SURGERY    . CHOLECYSTECTOMY       OB History   No obstetric history on file.      Home Medications    Prior to Admission medications   Medication Sig Start Date End Date Taking? Authorizing Provider  albuterol (PROVENTIL HFA;VENTOLIN HFA) 108 (90 BASE) MCG/ACT inhaler Inhale 2 puffs into the lungs every 6 (six) hours as needed. Patient uses this medication for shortness of breath and prn.    [provider]  allopurinol (ZYLOPRIM) 100 MG tablet Take 200 mg by mouth daily.    [provider]   aspirin-acetaminophen-caffeine (EXCEDRIN MIGRAINE) [phone] MG per tablet Take 2 tablets by mouth every 6 (six) hours as needed. Patient used this medication for her headache.    [provider]  colchicine 0.6 MG tablet Take 0.6 mg by mouth daily.    [provider]  doxycycline (VIBRAMYCIN) 100 MG capsule Take 1 capsule (100 mg total) by mouth 2 (two) times daily for 5 days. 04/09/19 04/14/19  Nuala Alpha A, PA-C  furosemide (LASIX) 20 MG tablet  01/19/17   [provider]  hydrOXYzine (ATARAX/VISTARIL) 25 MG tablet Take 1 tablet (25 mg total) by mouth every 6 (six) hours as needed for itching. 08/15/17   Julianne Rice, MD  ibuprofen (ADVIL,MOTRIN) 600 MG tablet Take 1 tablet (600 mg total) by mouth every 6 (six) hours as needed. 11/24/16   Shary Decamp, PA-C  lisinopril (PRINIVIL,ZESTRIL) 20 MG tablet  01/20/17   [provider]  methocarbamol (ROBAXIN) 500 MG tablet Take 1 tablet (500 mg total) by mouth 2 (two) times daily. 11/24/16   Shary Decamp, PA-C  NIFEdipine (PROCARDIA XL/ADALAT-CC) 90 MG 24 hr tablet  01/09/17   [provider]  predniSONE (DELTASONE) 20 MG tablet 3 tabs po day one, then 2 po daily x 4 days 08/15/17   Julianne Rice, MD  Vitamin D, Ergocalciferol, (DRISDOL) 50000 units CAPS capsule  12/23/16   [provider]    Family History Family History  Problem Relation Age of Onset  . Hypertension Mother   . Diabetes  Neg Hx   . Heart attack Neg Hx   . Hyperlipidemia Neg Hx   . Sudden death Neg Hx     Social History Social History   Tobacco Use  . Smoking status: Never Smoker  . Smokeless tobacco: Never Used  Substance Use Topics  . Alcohol use: No  . Drug use: No     Allergies   Amoxicillin and Vicodin [hydrocodone-acetaminophen]   Review of Systems Review of Systems Ten systems are reviewed and are negative for acute change except as noted in the HPI   Physical Exam Updated Vital Signs BP 120/84 (BP  Location: Left Arm)   Pulse 90   Temp 98.8 F (37.1 C) (Oral)   Resp 20   Ht 5\' 4"  (1.626 m)   Wt 104.3 kg   SpO2 97%   BMI 39.48 kg/m   Physical Exam Constitutional:      General: She is not in acute distress.    Appearance: Normal appearance. She is well-developed. She is not ill-appearing or diaphoretic.  HENT:     Head: Normocephalic and atraumatic.     Jaw: There is normal jaw occlusion. No trismus.     Right Ear: Tympanic membrane, ear canal and external ear normal. No mastoid tenderness.     Left Ear: Ear canal and external ear normal. No mastoid tenderness. Tympanic membrane is erythematous and bulging.     Nose: Rhinorrhea present. Rhinorrhea is clear.     Right Nostril: No epistaxis.     Left Nostril: No epistaxis.     Mouth/Throat:     Mouth: Mucous membranes are moist.     Pharynx: Oropharynx is clear.  Eyes:     General: Vision grossly intact. Gaze aligned appropriately.     Extraocular Movements: Extraocular movements intact.     Conjunctiva/sclera: Conjunctivae normal.     Pupils: Pupils are equal, round, and reactive to light.     Comments: Visual fields grossly intact bilaterally  Neck:     Musculoskeletal: Full passive range of motion without pain, normal range of motion and neck supple.     Trachea: Trachea and phonation normal. No tracheal tenderness or tracheal deviation.     Meningeal: Brudzinski's sign absent.  Pulmonary:     Effort: Pulmonary effort is normal. No respiratory distress.  Abdominal:     General: There is no distension.     Palpations: Abdomen is soft.     Tenderness: There is no abdominal tenderness. There is no guarding or rebound.  Musculoskeletal: Normal range of motion.  Skin:    General: Skin is warm and dry.  Neurological:     Mental Status: She is alert.     GCS: GCS eye subscore is 4. GCS verbal subscore is 5. GCS motor subscore is 6.     Comments: Speech is clear and goal oriented, follows commands Major Cranial nerves  without deficit, no facial droop Moves extremities without ataxia, coordination intact  Psychiatric:        Behavior: Behavior normal.      ED Treatments / Results  Labs (all labs ordered are listed, but only abnormal results are displayed) Labs Reviewed - No data to display  EKG None  Radiology No results found.  Procedures Procedures (including critical care time)  Medications Ordered in ED Medications  doxycycline (VIBRA-TABS) tablet 100 mg (100 mg Oral Given 04/09/19 1917)     Initial Impression / Assessment and Plan / ED Course  I have reviewed the triage  vital signs and the nursing notes.  Pertinent labs & imaging results that were available during my care of the patient were reviewed by me and considered in my medical decision making (see chart for details).     53 year old female presents today with left-sided otalgia, examination consistent today with acute left otitis media.  No concern for mastoiditis, meningitis, malignant otitis externa, no vesicles suggestive of zoster, no facial palsy or neuro complaint or other acute pathologies at this time.  She has no history of recent antibiotic use, she has a history of amoxicillin allergy with hives, per up-to-date we will treat with doxycycline 100 mg twice daily x7 days.  Patient with history of hysterectomy and denies chance of pregnancy.  Advised follow-up with PCP in the next 5 days for recheck and to return to the ER for any new or worsening symptoms.  Patient has been given referral to ENT for follow-up if her symptoms do not improve in the next few days I have encouraged her to call.   At this time there does not appear to be any evidence of an acute emergency medical condition and the patient appears stable for discharge with appropriate outpatient follow up. Diagnosis was discussed with patient who verbalizes understanding of care plan and is agreeable to discharge. I have discussed return precautions with patient  who verbalizes understanding of return precautions. Patient encouraged to follow-up with their PCP and ENT. All questions answered.  Patient's case discussed with Dr. Sherry Ruffing who agrees with plan to discharge with doxycycline and PCP/ENT follow-up.   Note: Portions of this report may have been transcribed using voice recognition software. Every effort was made to ensure accuracy; however, inadvertent computerized transcription errors may still be present. Final Clinical Impressions(s) / ED Diagnoses   Final diagnoses:  Non-recurrent acute suppurative otitis media of left ear without spontaneous rupture of tympanic membrane    ED Discharge Orders         Ordered    doxycycline (VIBRAMYCIN) 100 MG capsule  2 times daily     04/09/19 [address] 04/09/19 1923    Tegeler, Gwenyth Allegra, MD 04/09/19 [phone]

## 2019-04-09 NOTE — ED Triage Notes (Addendum)
Pt c/o left earache x 2 days-denies flu like sx-states she had a recent neg covid test during a annual physical-NAD-steady gait

## 2019-05-24 ENCOUNTER — Other Ambulatory Visit: Payer: Self-pay

## 2019-05-24 ENCOUNTER — Emergency Department (HOSPITAL_BASED_OUTPATIENT_CLINIC_OR_DEPARTMENT_OTHER)
Admission: EM | Admit: 2019-05-24 | Discharge: 2019-05-24 | Disposition: A | Discharge status: Home / Self Care | Payer: Managed Care, Other (non HMO) | Attending: Emergency Medicine | Admitting: Emergency Medicine

## 2019-05-24 ENCOUNTER — Encounter (HOSPITAL_BASED_OUTPATIENT_CLINIC_OR_DEPARTMENT_OTHER): Payer: Self-pay | Admitting: Oncology

## 2019-05-24 DIAGNOSIS — R52 Pain, unspecified: Secondary | ICD-10-CM | POA: Diagnosis present

## 2019-05-24 DIAGNOSIS — Z79899 Other long term (current) drug therapy: Secondary | ICD-10-CM | POA: Diagnosis not present

## 2019-05-24 DIAGNOSIS — Z20822 Contact with and (suspected) exposure to covid-19: Secondary | ICD-10-CM | POA: Insufficient documentation

## 2019-05-24 HISTORY — DX: Chronic kidney disease, stage 4 (severe): N18.4

## 2019-05-24 LAB — SARS CORONAVIRUS 2 (TAT 6-24 HRS): SARS Coronavirus 2: NEGATIVE

## 2019-05-24 NOTE — ED Provider Notes (Signed)
[facility] Provider Note: Georgena Spurling, MD, FACEP  CSN: [id number] MRN: [id number] ARRIVAL: 05/24/19 at [place]: [place] [facility]   HISTORY OF PRESENT ILLNESS  05/24/19 6:28 AM Kathy Elliott is a 54 y.o. female who is here with generalized body aches and a headache that began yesterday.  She rates her pain as a 7 out of 10.  She took some Excedrin with partial relief of her headache.  She has had a scratchy throat which is not severe.  She denies nasal congestion, loss of taste or smell, cough, shortness of breath, nausea, vomiting or diarrhea.   Past Medical History:  Diagnosis Date  . Asthma   . CKD (chronic kidney disease) stage 4, GFR 15-29 ml/min (HCC)   . Hypertension     Past Surgical History:  Procedure Laterality Date  . ABDOMINAL HYSTERECTOMY    . BREAST REDUCTION SURGERY    . CHOLECYSTECTOMY      Family History  Problem Relation Age of Onset  . Hypertension Mother   . Diabetes Neg Hx   . Heart attack Neg Hx   . Hyperlipidemia Neg Hx   . Sudden death Neg Hx     Social History   Tobacco Use  . Smoking status: Never Smoker  . Smokeless tobacco: Never Used  Substance Use Topics  . Alcohol use: No  . Drug use: No    Prior to Admission medications   Medication Sig Start Date End Date Taking? Authorizing Provider  albuterol (PROVENTIL HFA;VENTOLIN HFA) 108 (90 BASE) MCG/ACT inhaler Inhale 2 puffs into the lungs every 6 (six) hours as needed. Patient uses this medication for shortness of breath and prn.    [provider]  allopurinol (ZYLOPRIM) 100 MG tablet Take 200 mg by mouth daily.    [provider]  aspirin-acetaminophen-caffeine (EXCEDRIN MIGRAINE) [phone] MG per tablet Take 2 tablets by mouth every 6 (six) hours as needed. Patient used this medication for her headache.    [provider]  colchicine 0.6 MG tablet Take 0.6 mg by mouth daily.    [provider]    furosemide (LASIX) 20 MG tablet  01/19/17   [provider]  lisinopril (PRINIVIL,ZESTRIL) 20 MG tablet  01/20/17   [provider]  NIFEdipine (PROCARDIA XL/ADALAT-CC) 90 MG 24 hr tablet  01/09/17   [provider]  Vitamin D, Ergocalciferol, (DRISDOL) 50000 units CAPS capsule  12/23/16   [provider]    Allergies Amoxicillin and Vicodin [hydrocodone-acetaminophen]   REVIEW OF SYSTEMS  Negative except as noted here or in the History of Present Illness.   PHYSICAL EXAMINATION  Initial Vital Signs Blood pressure 127/81, pulse 99, temperature 98.8 F (37.1 C), temperature source Oral, resp. rate 16, height 5\' 4"  (1.626 m), weight 104.3 kg, SpO2 99 %.  Examination General: Well-developed, well-nourished female in no acute distress; appearance consistent with age of record HENT: normocephalic; atraumatic; mild pharyngeal erythema without exudate Eyes: pupils equal, round and reactive to light; extraocular muscles intact Neck: supple; no lymphadenopathy Heart: regular rate and rhythm Lungs: clear to auscultation bilaterally Abdomen: soft; nondistended; nontender; bowel sounds present Extremities: No deformity; full range of motion Neurologic: Awake, alert and oriented; motor function intact in all extremities and symmetric; no facial droop Skin: Warm and dry Psychiatric: Normal mood and affect   RESULTS  Summary of this visit's results, reviewed and interpreted by myself:   EKG Interpretation  Date/Time:  Ventricular Rate:    PR Interval:    QRS Duration:   QT Interval:    QTC Calculation:   R Axis:     Text Interpretation:        Laboratory Studies: No results found for this or any previous visit (from the past 24 hour(s)). Imaging Studies: No results found.  ED COURSE and MDM  Nursing notes, initial and subsequent vitals signs, including pulse oximetry, reviewed and interpreted by myself.  Vitals:   05/24/19 0615 05/24/19  0618  BP: 127/81   Pulse: 99   Resp: 16   Temp: 98.8 F (37.1 C)   TempSrc: Oral   SpO2: 99%   Weight:  104.3 kg  Height:  5\' 4"  (1.626 m)   Medications - No data to display  Patient symptoms are concerning for early Covid.  We will test her and advised her to quarantine pending results.  She was advised to take Tylenol as needed.  She has kidney disease and NSAIDs are contraindicated.  PROCEDURES  Procedures   ED DIAGNOSES     ICD-10-CM   1. Suspected COVID-19 virus infection  Z20.822        Eligha Kmetz, Jenny Reichmann, MD 05/24/19 [phone]

## 2019-05-24 NOTE — ED Triage Notes (Signed)
Pt c/o generalized body aches and HA that started yesterday.  Pt took excedrin tension HA w/o relief.  Denies any other associated sx.

## 2020-05-15 ENCOUNTER — Emergency Department (HOSPITAL_BASED_OUTPATIENT_CLINIC_OR_DEPARTMENT_OTHER)
Admission: EM | Admit: 2020-05-15 | Discharge: 2020-05-15 | Disposition: A | Discharge status: Home / Self Care | Payer: BC Managed Care – PPO | Attending: Emergency Medicine | Admitting: Emergency Medicine

## 2020-05-15 ENCOUNTER — Encounter (HOSPITAL_BASED_OUTPATIENT_CLINIC_OR_DEPARTMENT_OTHER): Payer: Self-pay | Admitting: *Deleted

## 2020-05-15 ENCOUNTER — Other Ambulatory Visit: Payer: Self-pay

## 2020-05-15 DIAGNOSIS — I129 Hypertensive chronic kidney disease with stage 1 through stage 4 chronic kidney disease, or unspecified chronic kidney disease: Secondary | ICD-10-CM | POA: Insufficient documentation

## 2020-05-15 DIAGNOSIS — J45909 Unspecified asthma, uncomplicated: Secondary | ICD-10-CM | POA: Diagnosis not present

## 2020-05-15 DIAGNOSIS — R509 Fever, unspecified: Secondary | ICD-10-CM | POA: Diagnosis present

## 2020-05-15 DIAGNOSIS — N184 Chronic kidney disease, stage 4 (severe): Secondary | ICD-10-CM | POA: Insufficient documentation

## 2020-05-15 DIAGNOSIS — U071 COVID-19: Secondary | ICD-10-CM | POA: Insufficient documentation

## 2020-05-15 DIAGNOSIS — Z79899 Other long term (current) drug therapy: Secondary | ICD-10-CM | POA: Insufficient documentation

## 2020-05-15 LAB — COMPREHENSIVE METABOLIC PANEL
ALT: 30 U/L (ref 0–44)
AST: 29 U/L (ref 15–41)
Albumin: 3.1 g/dL — ABNORMAL LOW (ref 3.5–5.0)
Alkaline Phosphatase: 65 U/L (ref 38–126)
Anion gap: 8 (ref 5–15)
BUN: 29 mg/dL — ABNORMAL HIGH (ref 6–20)
CO2: 21 mmol/L — ABNORMAL LOW (ref 22–32)
Calcium: 8.4 mg/dL — ABNORMAL LOW (ref 8.9–10.3)
Chloride: 108 mmol/L (ref 98–111)
Creatinine, Ser: 2.49 mg/dL — ABNORMAL HIGH (ref 0.44–1.00)
GFR, Estimated: 22 mL/min — ABNORMAL LOW (ref >60)
Glucose, Bld: 87 mg/dL (ref 70–99)
Potassium: 4.1 mmol/L (ref 3.5–5.1)
Sodium: 137 mmol/L (ref 135–145)
Total Bilirubin: 0.4 mg/dL (ref 0.3–1.2)
Total Protein: 6.2 g/dL — ABNORMAL LOW (ref 6.5–8.1)

## 2020-05-15 LAB — RESP PANEL BY RT-PCR (FLU A&B, COVID) ARPGX2
Influenza A by PCR: NEGATIVE
Influenza B by PCR: NEGATIVE
SARS Coronavirus 2 by RT PCR: POSITIVE — AB

## 2020-05-15 LAB — CBC WITH DIFFERENTIAL/PLATELET
Abs Immature Granulocytes: 0.04 10*3/uL (ref 0.00–0.07)
Basophils Absolute: 0.1 10*3/uL (ref 0.0–0.1)
Basophils Relative: 1 %
Eosinophils Absolute: 0 10*3/uL (ref 0.0–0.5)
Eosinophils Relative: 1 %
HCT: 42.5 % (ref 36.0–46.0)
Hemoglobin: 14 g/dL (ref 12.0–15.0)
Immature Granulocytes: 1 %
Lymphocytes Relative: 7 %
Lymphs Abs: 0.3 10*3/uL — ABNORMAL LOW (ref 0.7–4.0)
MCH: 31.2 pg (ref 26.0–34.0)
MCHC: 32.9 g/dL (ref 30.0–36.0)
MCV: 94.7 fL (ref 80.0–100.0)
Monocytes Absolute: 0.7 10*3/uL (ref 0.1–1.0)
Monocytes Relative: 14 %
Neutro Abs: 3.9 10*3/uL (ref 1.7–7.7)
Neutrophils Relative %: 76 %
Platelets: 131 10*3/uL — ABNORMAL LOW (ref 150–400)
RBC: 4.49 MIL/uL (ref 3.87–5.11)
RDW: 13.6 % (ref 11.5–15.5)
WBC: 5 10*3/uL (ref 4.0–10.5)
nRBC: 0 % (ref 0.0–0.2)

## 2020-05-15 MED ORDER — CLONIDINE HCL 0.1 MG PO TABS
0.2000 mg | ORAL_TABLET | Freq: Once | ORAL | Status: AC
  Administered 2020-05-15: 0.2 mg via ORAL
  Filled 2020-05-15: qty 2

## 2020-05-15 MED ORDER — ACETAMINOPHEN 325 MG PO TABS
650.0000 mg | ORAL_TABLET | Freq: Once | ORAL | Status: AC
  Administered 2020-05-15: 650 mg via ORAL
  Filled 2020-05-15: qty 2

## 2020-05-15 NOTE — ED Notes (Signed)
Critical results received; covid positive; Dr Almyra Free aware.

## 2020-05-15 NOTE — Discharge Instructions (Addendum)
Call your primary care doctor in the next 1-2 days to arrange video follow-up.  Use a finger pulse oximeter at home.  You may purchase one at [facility] or [facility] or online.  If the numbers drops and stays below 90%, return immediately back to the ER.  Otherwise increase your fluid intake, isolate at home for 5 days after symptoms resolve, and inform recent close contacts of the need to test for Covid.  Call the Monoclonal antibody infusion center this Sunday at [phone]

## 2020-05-15 NOTE — ED Provider Notes (Signed)
[facility] EMERGENCY DEPARTMENT Provider Note   CSN: [id number] Arrival date & time: 05/15/20  1648     History Chief Complaint  Patient presents with  . Headache    fever    Kathy Elliott is a 54 y.o. female.  Patient presents ER chief complaint of fevers body aches headache abdominal pain cough ear pain ongoing for 3 days now.  Denies vomiting or diarrhea.  Denies chest pain at this time.        Past Medical History:  Diagnosis Date  . Asthma   . CKD (chronic kidney disease) stage 4, GFR 15-29 ml/min (HCC)   . Hypertension     Patient Active Problem List   Diagnosis Date Noted  . Left ankle pain 02/07/2017  . Proteinuria 06/26/2015  . Metabolic bone disease 63/14/9702  . 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 Abnormal brain MRI 03/25/2014  . Chronic renal failure 03/21/2014  . Low back pain 11/05/2012  . Hyperlipidemia 10/20/2012  . Essential hypertension 10/20/2012  . Allergic rhinitis 10/20/2012  . Asthma 08/16/2012    Past Surgical History:  Procedure Laterality Date  . ABDOMINAL HYSTERECTOMY    . BREAST REDUCTION SURGERY    . CHOLECYSTECTOMY       OB History   No obstetric history on file.     Family History  Problem Relation Age of Onset  . Hypertension Mother   . Diabetes Neg Hx   . Heart attack Neg Hx   . Hyperlipidemia Neg Hx   . Sudden death Neg Hx     Social History   Tobacco Use  . Smoking status: Never Smoker  . Smokeless tobacco: Never Used  Vaping Use  . Vaping Use: Never used  Substance Use Topics  . Alcohol use: No  . Drug use: No    Home Medications Prior to Admission medications   Medication Sig Start Date End Date Taking? Authorizing Provider  albuterol (PROVENTIL HFA;VENTOLIN HFA) 108 (90 BASE) MCG/ACT inhaler Inhale 2 puffs into the lungs every 6 (six) hours as needed. Patient uses this medication for shortness of breath and prn.   Yes [provider]   aspirin-acetaminophen-caffeine (EXCEDRIN MIGRAINE) [phone] MG per tablet Take 2 tablets by mouth every 6 (six) hours as needed. Patient used this medication for her headache.   Yes [provider]  allopurinol (ZYLOPRIM) 100 MG tablet Take 200 mg by mouth daily.    [provider]  colchicine 0.6 MG tablet Take 0.6 mg by mouth daily.    [provider]  furosemide (LASIX) 20 MG tablet  01/19/17   [provider]  lisinopril (PRINIVIL,ZESTRIL) 20 MG tablet  01/20/17   [provider]  NIFEdipine (PROCARDIA XL/ADALAT-CC) 90 MG 24 hr tablet  01/09/17   [provider]  Vitamin D, Ergocalciferol, (DRISDOL) 50000 units CAPS capsule  12/23/16   [provider]    Allergies    Amoxicillin and Vicodin [hydrocodone-acetaminophen]  Review of Systems   Review of Systems  Constitutional: Positive for fever.  HENT: Negative for ear pain.   Eyes: Negative for pain.  Respiratory: Negative for cough.   Cardiovascular: Negative for chest pain.  Gastrointestinal: Positive for abdominal pain.  Genitourinary: Negative for flank pain.  Musculoskeletal: Negative for back pain.  Skin: Negative for rash.  Neurological: Negative for headaches.    Physical Exam Updated Vital Signs BP (!) 166/105   Pulse 94   Temp 100.2 F (37.9 C) (Oral)  Resp 16   Ht 5\' 4"  (1.626 m)   Wt 103.9 kg   SpO2 98%   BMI 39.31 kg/m   Physical Exam Constitutional:      General: She is not in acute distress.    Appearance: Normal appearance.  HENT:     Head: Normocephalic.     Nose: Nose normal.  Eyes:     Extraocular Movements: Extraocular movements intact.  Cardiovascular:     Rate and Rhythm: Normal rate.  Pulmonary:     Effort: Pulmonary effort is normal.  Musculoskeletal:        General: Normal range of motion.     Cervical back: Normal range of motion.  Neurological:     General: No focal deficit present.     Mental Status: She is alert.  Mental status is at baseline.     ED Results / Procedures / Treatments   Labs (all labs ordered are listed, but only abnormal results are displayed) Labs Reviewed  RESP PANEL BY RT-PCR (FLU A&B, COVID) ARPGX2 - Abnormal; Notable for the following components:      Result Value   SARS Coronavirus 2 by RT PCR POSITIVE (*)    All other components within normal limits  CBC WITH DIFFERENTIAL/PLATELET - Abnormal; Notable for the following components:   Platelets 131 (*)    Lymphs Abs 0.3 (*)    All other components within normal limits  COMPREHENSIVE METABOLIC PANEL - Abnormal; Notable for the following components:   CO2 21 (*)    BUN 29 (*)    Creatinine, Ser 2.49 (*)    Calcium 8.4 (*)    Total Protein 6.2 (*)    Albumin 3.1 (*)    GFR, Estimated 22 (*)    All other components within normal limits  URINALYSIS, ROUTINE W REFLEX MICROSCOPIC    EKG None  Radiology No results found.  Procedures Procedures (including critical care time)  Medications Ordered in ED Medications  acetaminophen (TYLENOL) tablet 650 mg (650 mg Oral Given 05/15/20 1855)  cloNIDine (CATAPRES) tablet 0.2 mg (0.2 mg Oral Given 05/15/20 2004)    ED Course  I have reviewed the triage vital signs and the nursing notes.  Pertinent labs & imaging results that were available during my care of the patient were reviewed by me and considered in my medical decision making (see chart for details).    MDM Rules/Calculators/A&P                          Patient tests are positive for Covid.  Labs otherwise consistent with her history of kidney disease.  Discussed medical antibiotic therapy with the patient and advised her to follow-up with infusion center if she would like to pursue this.  Advised me to return for difficulty breathing or any additional concerns.   Final Clinical Impression(s) / ED Diagnoses Final diagnoses:  COVID-19 virus infection    Rx / DC Orders ED Discharge Orders    None        Luna Fuse, MD 05/15/20 2019

## 2020-05-15 NOTE — ED Notes (Signed)
Pt here with fever since last night, headache and upper abdominal pain. Pt denies vomiting or diarrhea but some nausea

## 2020-05-15 NOTE — ED Notes (Signed)
Discharge instructions and follow up care discussed with patient. Verbalized understanding. Departs ED at this time in stable condition.

## 2020-05-15 NOTE — ED Triage Notes (Signed)
Pt reports headache, back pain, abdominal pain, and ear pain since last night. She has stage 4 kidney disease. Temp 102.7 in triage

## 2021-07-19 ENCOUNTER — Other Ambulatory Visit: Payer: Self-pay

## 2021-07-19 ENCOUNTER — Emergency Department (HOSPITAL_BASED_OUTPATIENT_CLINIC_OR_DEPARTMENT_OTHER)
Admission: EM | Admit: 2021-07-19 | Discharge: 2021-07-19 | Disposition: A | Discharge status: Home / Self Care | Payer: BC Managed Care – PPO | Attending: Emergency Medicine | Admitting: Emergency Medicine

## 2021-07-19 ENCOUNTER — Encounter (HOSPITAL_BASED_OUTPATIENT_CLINIC_OR_DEPARTMENT_OTHER): Payer: Self-pay | Admitting: *Deleted

## 2021-07-19 DIAGNOSIS — I129 Hypertensive chronic kidney disease with stage 1 through stage 4 chronic kidney disease, or unspecified chronic kidney disease: Secondary | ICD-10-CM | POA: Insufficient documentation

## 2021-07-19 DIAGNOSIS — N184 Chronic kidney disease, stage 4 (severe): Secondary | ICD-10-CM | POA: Diagnosis not present

## 2021-07-19 DIAGNOSIS — M5442 Lumbago with sciatica, left side: Secondary | ICD-10-CM

## 2021-07-19 DIAGNOSIS — Z79899 Other long term (current) drug therapy: Secondary | ICD-10-CM | POA: Diagnosis not present

## 2021-07-19 DIAGNOSIS — M545 Low back pain, unspecified: Secondary | ICD-10-CM | POA: Diagnosis present

## 2021-07-19 DIAGNOSIS — X501XXA Overexertion from prolonged static or awkward postures, initial encounter: Secondary | ICD-10-CM | POA: Diagnosis not present

## 2021-07-19 MED ORDER — PREDNISONE 10 MG (21) PO TBPK: ORAL_TABLET | Freq: Every day | ORAL | 0 refills | Status: AC

## 2021-07-19 NOTE — Discharge Instructions (Addendum)
I am prescribing you a strong steroid medication called prednisone.  Please only take this as prescribed.  Please take it early in the morning, as this medication can be stimulating and make it difficult to sleep at night. ? ?Your symptoms appear consistent with sciatica.  I have attached a significant amount of information on sciatica as well as how to rehab this.  Please continue to monitor your symptoms closely.  If you develop any new or worsening symptoms please come back to the emergency department immediately.  Please follow-up with your regular doctor regarding your symptoms as well as your visit today. ?

## 2021-07-19 NOTE — ED Provider Notes (Signed)
?[facility] ?Provider Note ? ? ?CSN: [id number] ?Arrival date & time: 07/19/21  1501 ? ?  ? ?History ? ?Chief Complaint  ?Patient presents with  ? Back Pain  ? ? ?Brianni Manthe is a 56 y.o. female. ? ?HPI ?Patient is a 56 year old female with a history of CKD stage IV secondary to hypertensive nephrosclerosis, hyperlipidemia, hypertension, who presents to the emergency department due to low back pain.  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  She states that about 4 to 5 days ago she began experiencing low back pain.  States that her symptoms have been waxing and waning.  States that her pain will sometimes radiate down her left posterior leg.  Denies any numbness or tingling.  Denies any dysuria, difficulty urinating, urinary frequency, constipation, bowel/bladder incontinence, history of IVDA, night sweats, unintentional weight loss. ?  ? ?Home Medications ?Prior to Admission medications   ?Medication Sig Start Date End Date Taking? Authorizing Provider  ?allopurinol (ZYLOPRIM) 100 MG tablet Take 200 mg by mouth daily.   Yes [provider]  ?colchicine 0.6 MG tablet Take 0.6 mg by mouth daily.   Yes [provider]  ?furosemide (LASIX) 20 MG tablet  01/19/17  Yes [provider]  ?lisinopril (PRINIVIL,ZESTRIL) 20 MG tablet  01/20/17  Yes [provider]  ?NIFEdipine (PROCARDIA XL/ADALAT-CC) 90 MG 24 hr tablet  01/09/17  Yes [provider]  ?predniSONE (STERAPRED UNI-PAK 21 TAB) 10 MG (21) TBPK tablet Take by mouth daily. Take 6 tabs by mouth daily  for 2 days, then 5 tabs for 2 days, then 4 tabs for 2 days, then 3 tabs for 2 days, 2 tabs for 2 days, then 1 tab by mouth daily for 2 days 07/19/21  Yes Rayna Sexton, PA-C  ?Vitamin D, Ergocalciferol, (DRISDOL) 50000 units CAPS capsule  12/23/16  Yes [provider]  ?albuterol (PROVENTIL HFA;VENTOLIN HFA) 108 (90 BASE) MCG/ACT inhaler  Inhale 2 puffs into the lungs every 6 (six) hours as needed. Patient uses this medication for shortness of breath and prn.    [provider]  ?aspirin-acetaminophen-caffeine (EXCEDRIN MIGRAINE) [phone] MG per tablet Take 2 tablets by mouth every 6 (six) hours as needed. Patient used this medication for her headache.    [provider]  ?   ? ?Allergies    ?Amoxicillin and Vicodin [hydrocodone-acetaminophen]   ? ?Review of Systems   ?Review of Systems  ?Constitutional:  Negative for chills, fever and unexpected weight change.  ?Gastrointestinal:  Negative for nausea and vomiting.  ?Musculoskeletal:  Positive for back pain and myalgias.  ?Neurological:  Negative for weakness and numbness.  ? ?Physical Exam ?Updated Vital Signs ?BP (!) 126/94 (BP Location: Right Arm)   Pulse 92   Temp 98 ?F (36.7 ?C) (Oral)   Resp 16   Ht 5\' 4"  (1.626 m)   Wt 105.2 kg   SpO2 100%   BMI 39.82 kg/m?  ?Physical Exam ?Vitals and nursing note reviewed.  ?Constitutional:   ?   General: She is not in acute distress. ?   Appearance: She is well-developed.  ?HENT:  ?   Head: Normocephalic and atraumatic.  ?   Right Ear: External ear normal.  ?   Left Ear: External ear normal.  ?Eyes:  ?   General: No scleral icterus.    ?   Right eye: No discharge.     ?  Left eye: No discharge.  ?   Conjunctiva/sclera: Conjunctivae normal.  ?Neck:  ?   Trachea: No tracheal deviation.  ?Cardiovascular:  ?   Rate and Rhythm: Normal rate.  ?Pulmonary:  ?   Effort: Pulmonary effort is normal. No respiratory distress.  ?   Breath sounds: No stridor.  ?Abdominal:  ?   General: There is no distension.  ?Musculoskeletal:     ?   General: Tenderness present. No swelling or deformity.  ?   Cervical back: Neck supple.  ?   Comments: Mild tenderness noted around L5-S1.  Additional mild tenderness noted along the bilateral lumbar paraspinal musculature and left gluteal musculature.  Negative straight leg raise noted bilaterally.  Distal  sensation intact.  Strength is 5/5 in the bilateral lower extremities.  2+ DP pulses.  ?Skin: ?   General: Skin is warm and dry.  ?   Findings: No rash.  ?Neurological:  ?   Mental Status: She is alert.  ?   Cranial Nerves: Cranial nerve deficit: no gross deficits.  ? ?ED Results / Procedures / Treatments   ?Labs ?(all labs ordered are listed, but only abnormal results are displayed) ?Labs Reviewed - No data to display ? ?EKG ?None ? ?Radiology ?No results found. ? ?Procedures ?Procedures  ? ?Medications Ordered in ED ?Medications - No data to display ? ?ED Course/ Medical Decision Making/ A&P ?  ?                        ?Medical Decision Making ?Risk ?Prescription drug management. ? ?Patient is a 56 year old female who presents to the emergency department due to low back pain that started about 4 to 5 days ago.  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  Denies any falls or trauma.  Notes that her pain will sometimes radiate down her left posterior leg. ? ?On my exam patient has mild tenderness along the midline lumbar spine around L5-S1.  Additional mild tenderness along the bilateral lumbar paraspinal musculature and left gluteal musculature.  Negative straight leg raise.  Neurovascularly intact in the bilateral lower extremities.  Doubt cauda equina at this time.  Afebrile, nontachycardic, and nontoxic-appearing.  Exam and history does not appear consistent with SEA or malignancy.  Although her straight leg raise is negative her symptoms do appear consistent with sciatica on the left side. ? ?Patient has a history of CKD stage IV.  Will discharge on a prednisone taper.  Per up-to-date, no dosage adjustment is necessary given her altered kidney function. ? ?Feel that the patient is stable for discharge at this time and she is agreeable.  Recommended PCP follow-up.  Discussed signs and symptoms warranting return to the emergency department.  She verbalized understanding.  Her questions were  answered and she was amicable at the time of discharge. ?Final Clinical Impression(s) / ED Diagnoses ?Final diagnoses:  ?Acute bilateral low back pain with left-sided sciatica  ? ?Rx / DC Orders ?ED Discharge Orders   ? ?      Ordered  ?  predniSONE (STERAPRED UNI-PAK 21 TAB) 10 MG (21) TBPK tablet  Daily       ? 07/19/21 1719  ? ?  ?  ? ?  ? ? ?  ?Rayna Sexton, PA-C ?07/19/21 1726 ? ?  ?Lorelle Gibbs, DO ?07/19/21 2308 ? ?

## 2021-07-19 NOTE — ED Triage Notes (Signed)
Lower back pain with radiation down her left leg. Pt is ambulatory. She took Tylenol today.  ?
